# Patient Record
Sex: Female | Born: 1945
Health system: Southern US, Community
[De-identification: ages and names within clinical notes are randomized; demographics above are authoritative.]

## PROBLEM LIST (undated history)

## (undated) DIAGNOSIS — Z923 Personal history of irradiation: Secondary | ICD-10-CM

## (undated) DIAGNOSIS — H33329 Round hole, unspecified eye: Secondary | ICD-10-CM

## (undated) DIAGNOSIS — Z800 Family history of malignant neoplasm of digestive organs: Secondary | ICD-10-CM

## (undated) DIAGNOSIS — J45909 Unspecified asthma, uncomplicated: Secondary | ICD-10-CM

## (undated) DIAGNOSIS — H409 Unspecified glaucoma: Secondary | ICD-10-CM

## (undated) DIAGNOSIS — R7309 Other abnormal glucose: Secondary | ICD-10-CM

## (undated) DIAGNOSIS — K219 Gastro-esophageal reflux disease without esophagitis: Secondary | ICD-10-CM

## (undated) DIAGNOSIS — K589 Irritable bowel syndrome without diarrhea: Secondary | ICD-10-CM

## (undated) DIAGNOSIS — K769 Liver disease, unspecified: Secondary | ICD-10-CM

## (undated) DIAGNOSIS — J309 Allergic rhinitis, unspecified: Secondary | ICD-10-CM

## (undated) DIAGNOSIS — I10 Essential (primary) hypertension: Secondary | ICD-10-CM

## (undated) DIAGNOSIS — M8580 Other specified disorders of bone density and structure, unspecified site: Secondary | ICD-10-CM

## (undated) DIAGNOSIS — Z8051 Family history of malignant neoplasm of kidney: Secondary | ICD-10-CM

## (undated) DIAGNOSIS — Z803 Family history of malignant neoplasm of breast: Secondary | ICD-10-CM

## (undated) DIAGNOSIS — Z8042 Family history of malignant neoplasm of prostate: Secondary | ICD-10-CM

## (undated) DIAGNOSIS — T7840XA Allergy, unspecified, initial encounter: Secondary | ICD-10-CM

## (undated) DIAGNOSIS — E042 Nontoxic multinodular goiter: Secondary | ICD-10-CM

## (undated) HISTORY — DX: Other specified disorders of bone density and structure, unspecified site: M85.80

## (undated) HISTORY — DX: Unspecified asthma, uncomplicated: J45.909

## (undated) HISTORY — PX: OTHER SURGICAL HISTORY: SHX169

## (undated) HISTORY — DX: Nontoxic multinodular goiter: E04.2

## (undated) HISTORY — DX: Irritable bowel syndrome without diarrhea: K58.9

## (undated) HISTORY — DX: Allergy, unspecified, initial encounter: T78.40XA

## (undated) HISTORY — PX: BREAST SURGERY: SHX581

## (undated) HISTORY — DX: Family history of malignant neoplasm of digestive organs: Z80.0

## (undated) HISTORY — DX: Family history of malignant neoplasm of prostate: Z80.42

## (undated) HISTORY — DX: Family history of malignant neoplasm of kidney: Z80.51

## (undated) HISTORY — PX: CHOLECYSTECTOMY: SHX55

## (undated) HISTORY — DX: Family history of malignant neoplasm of breast: Z80.3

## (undated) HISTORY — DX: Essential (primary) hypertension: I10

## (undated) HISTORY — PX: NOSE SURGERY: SHX723

## (undated) HISTORY — DX: Allergic rhinitis, unspecified: J30.9

## (undated) HISTORY — DX: Unspecified glaucoma: H40.9

## (undated) HISTORY — PX: EYE SURGERY: SHX253

## (undated) HISTORY — DX: Round hole, unspecified eye: H33.329

## (undated) HISTORY — DX: Liver disease, unspecified: K76.9

## (undated) HISTORY — DX: Other abnormal glucose: R73.09

## (undated) HISTORY — DX: Gastro-esophageal reflux disease without esophagitis: K21.9

---

## 1987-12-14 HISTORY — PX: ABDOMINAL HYSTERECTOMY: SHX81

## 1997-12-13 HISTORY — PX: BREAST EXCISIONAL BIOPSY: SUR124

## 1998-07-07 ENCOUNTER — Ambulatory Visit (HOSPITAL_BASED_OUTPATIENT_CLINIC_OR_DEPARTMENT_OTHER): Admission: RE | Admit: 1998-07-07 | Discharge: 1998-07-07 | Payer: Self-pay | Admitting: General Surgery

## 1998-09-05 ENCOUNTER — Ambulatory Visit (HOSPITAL_COMMUNITY): Admission: RE | Admit: 1998-09-05 | Discharge: 1998-09-05 | Payer: Self-pay | Admitting: Gastroenterology

## 1999-04-29 ENCOUNTER — Encounter: Payer: Self-pay | Admitting: General Surgery

## 1999-04-30 ENCOUNTER — Ambulatory Visit (HOSPITAL_COMMUNITY): Admission: RE | Admit: 1999-04-30 | Discharge: 1999-05-01 | Payer: Self-pay | Admitting: General Surgery

## 1999-04-30 ENCOUNTER — Encounter: Payer: Self-pay | Admitting: General Surgery

## 1999-10-31 ENCOUNTER — Emergency Department (HOSPITAL_COMMUNITY): Admission: EM | Admit: 1999-10-31 | Discharge: 1999-10-31 | Payer: Self-pay | Admitting: Emergency Medicine

## 1999-10-31 ENCOUNTER — Encounter: Payer: Self-pay | Admitting: Emergency Medicine

## 1999-11-24 ENCOUNTER — Ambulatory Visit: Admission: RE | Admit: 1999-11-24 | Discharge: 1999-11-24 | Payer: Self-pay | Admitting: Pulmonary Disease

## 2000-07-29 ENCOUNTER — Encounter: Admission: RE | Admit: 2000-07-29 | Discharge: 2000-07-29 | Payer: Self-pay | Admitting: Obstetrics and Gynecology

## 2000-07-29 ENCOUNTER — Encounter: Payer: Self-pay | Admitting: Obstetrics and Gynecology

## 2000-09-25 ENCOUNTER — Emergency Department (HOSPITAL_COMMUNITY): Admission: EM | Admit: 2000-09-25 | Discharge: 2000-09-26 | Payer: Self-pay | Admitting: Emergency Medicine

## 2000-09-26 ENCOUNTER — Encounter: Payer: Self-pay | Admitting: Emergency Medicine

## 2001-06-26 ENCOUNTER — Encounter: Admission: RE | Admit: 2001-06-26 | Discharge: 2001-06-26 | Payer: Self-pay | Admitting: Obstetrics and Gynecology

## 2001-06-26 ENCOUNTER — Encounter: Payer: Self-pay | Admitting: Obstetrics and Gynecology

## 2001-09-15 ENCOUNTER — Encounter: Payer: Self-pay | Admitting: Obstetrics and Gynecology

## 2001-09-15 ENCOUNTER — Encounter: Admission: RE | Admit: 2001-09-15 | Discharge: 2001-09-15 | Payer: Self-pay | Admitting: Obstetrics and Gynecology

## 2001-09-19 ENCOUNTER — Encounter: Admission: RE | Admit: 2001-09-19 | Discharge: 2001-09-19 | Payer: Self-pay | Admitting: Family Medicine

## 2001-09-19 ENCOUNTER — Encounter: Payer: Self-pay | Admitting: Family Medicine

## 2002-03-07 ENCOUNTER — Encounter: Payer: Self-pay | Admitting: Gastroenterology

## 2002-03-07 ENCOUNTER — Encounter: Admission: RE | Admit: 2002-03-07 | Discharge: 2002-03-07 | Payer: Self-pay | Admitting: Gastroenterology

## 2002-06-27 ENCOUNTER — Encounter: Payer: Self-pay | Admitting: Obstetrics and Gynecology

## 2002-06-27 ENCOUNTER — Encounter: Admission: RE | Admit: 2002-06-27 | Discharge: 2002-06-27 | Payer: Self-pay | Admitting: Obstetrics and Gynecology

## 2002-10-02 ENCOUNTER — Encounter: Payer: Self-pay | Admitting: Gastroenterology

## 2002-10-02 ENCOUNTER — Encounter: Admission: RE | Admit: 2002-10-02 | Discharge: 2002-10-02 | Payer: Self-pay | Admitting: Gastroenterology

## 2003-04-04 ENCOUNTER — Other Ambulatory Visit: Admission: RE | Admit: 2003-04-04 | Discharge: 2003-04-04 | Payer: Self-pay | Admitting: Obstetrics and Gynecology

## 2003-06-04 ENCOUNTER — Encounter: Payer: Self-pay | Admitting: Obstetrics and Gynecology

## 2003-06-04 ENCOUNTER — Encounter: Admission: RE | Admit: 2003-06-04 | Discharge: 2003-06-04 | Payer: Self-pay | Admitting: Obstetrics and Gynecology

## 2003-09-17 ENCOUNTER — Encounter: Admission: RE | Admit: 2003-09-17 | Discharge: 2003-09-17 | Payer: Self-pay | Admitting: Allergy

## 2003-09-17 ENCOUNTER — Encounter: Payer: Self-pay | Admitting: Allergy

## 2003-11-13 ENCOUNTER — Encounter: Admission: RE | Admit: 2003-11-13 | Discharge: 2003-11-13 | Payer: Self-pay | Admitting: Obstetrics and Gynecology

## 2004-04-13 ENCOUNTER — Other Ambulatory Visit: Admission: RE | Admit: 2004-04-13 | Discharge: 2004-04-13 | Payer: Self-pay | Admitting: Obstetrics and Gynecology

## 2004-10-19 ENCOUNTER — Encounter: Admission: RE | Admit: 2004-10-19 | Discharge: 2004-10-19 | Payer: Self-pay | Admitting: Obstetrics and Gynecology

## 2004-10-29 ENCOUNTER — Encounter: Admission: RE | Admit: 2004-10-29 | Discharge: 2004-10-29 | Payer: Self-pay | Admitting: Obstetrics and Gynecology

## 2005-04-15 ENCOUNTER — Other Ambulatory Visit: Admission: RE | Admit: 2005-04-15 | Discharge: 2005-04-15 | Payer: Self-pay | Admitting: Addiction Medicine

## 2005-11-11 ENCOUNTER — Encounter: Admission: RE | Admit: 2005-11-11 | Discharge: 2005-11-11 | Payer: Self-pay | Admitting: Obstetrics and Gynecology

## 2006-04-13 ENCOUNTER — Encounter: Payer: Self-pay | Admitting: Gastroenterology

## 2006-04-19 ENCOUNTER — Other Ambulatory Visit: Admission: RE | Admit: 2006-04-19 | Discharge: 2006-04-19 | Payer: Self-pay | Admitting: Obstetrics and Gynecology

## 2006-08-12 ENCOUNTER — Encounter: Admission: RE | Admit: 2006-08-12 | Discharge: 2006-08-12 | Payer: Self-pay | Admitting: Gastroenterology

## 2006-11-14 ENCOUNTER — Encounter: Admission: RE | Admit: 2006-11-14 | Discharge: 2006-11-14 | Payer: Self-pay | Admitting: Obstetrics and Gynecology

## 2006-12-13 LAB — HM COLONOSCOPY

## 2007-05-01 ENCOUNTER — Other Ambulatory Visit: Admission: RE | Admit: 2007-05-01 | Discharge: 2007-05-01 | Payer: Self-pay | Admitting: Obstetrics and Gynecology

## 2007-11-20 ENCOUNTER — Encounter: Admission: RE | Admit: 2007-11-20 | Discharge: 2007-11-20 | Payer: Self-pay | Admitting: Obstetrics and Gynecology

## 2007-12-14 HISTORY — PX: OTHER SURGICAL HISTORY: SHX169

## 2008-05-10 ENCOUNTER — Other Ambulatory Visit: Admission: RE | Admit: 2008-05-10 | Discharge: 2008-05-10 | Payer: Self-pay | Admitting: Obstetrics and Gynecology

## 2008-09-09 ENCOUNTER — Ambulatory Visit (HOSPITAL_BASED_OUTPATIENT_CLINIC_OR_DEPARTMENT_OTHER): Admission: RE | Admit: 2008-09-09 | Discharge: 2008-09-09 | Payer: Self-pay | Admitting: Urology

## 2008-09-09 ENCOUNTER — Encounter (INDEPENDENT_AMBULATORY_CARE_PROVIDER_SITE_OTHER): Payer: Self-pay | Admitting: Urology

## 2008-11-20 ENCOUNTER — Encounter: Admission: RE | Admit: 2008-11-20 | Discharge: 2008-11-20 | Payer: Self-pay | Admitting: Obstetrics and Gynecology

## 2009-04-12 LAB — CONVERTED CEMR LAB

## 2009-05-19 ENCOUNTER — Other Ambulatory Visit: Admission: RE | Admit: 2009-05-19 | Discharge: 2009-05-19 | Payer: Self-pay | Admitting: Obstetrics and Gynecology

## 2009-05-19 ENCOUNTER — Ambulatory Visit: Payer: Self-pay | Admitting: Obstetrics and Gynecology

## 2009-05-19 ENCOUNTER — Encounter: Payer: Self-pay | Admitting: Obstetrics and Gynecology

## 2009-06-06 ENCOUNTER — Ambulatory Visit: Payer: Self-pay | Admitting: Obstetrics and Gynecology

## 2009-07-15 ENCOUNTER — Encounter: Payer: Self-pay | Admitting: Endocrinology

## 2009-11-21 ENCOUNTER — Encounter: Admission: RE | Admit: 2009-11-21 | Discharge: 2009-11-21 | Payer: Self-pay | Admitting: Obstetrics and Gynecology

## 2010-02-17 ENCOUNTER — Ambulatory Visit: Payer: Self-pay | Admitting: Endocrinology

## 2010-02-17 DIAGNOSIS — I10 Essential (primary) hypertension: Secondary | ICD-10-CM

## 2010-02-17 DIAGNOSIS — K589 Irritable bowel syndrome without diarrhea: Secondary | ICD-10-CM | POA: Insufficient documentation

## 2010-02-17 DIAGNOSIS — J45909 Unspecified asthma, uncomplicated: Secondary | ICD-10-CM

## 2010-02-17 DIAGNOSIS — K219 Gastro-esophageal reflux disease without esophagitis: Secondary | ICD-10-CM | POA: Insufficient documentation

## 2010-02-17 DIAGNOSIS — J309 Allergic rhinitis, unspecified: Secondary | ICD-10-CM | POA: Insufficient documentation

## 2010-02-17 DIAGNOSIS — E042 Nontoxic multinodular goiter: Secondary | ICD-10-CM | POA: Insufficient documentation

## 2010-02-17 HISTORY — DX: Essential (primary) hypertension: I10

## 2010-02-17 HISTORY — DX: Allergic rhinitis, unspecified: J30.9

## 2010-02-17 HISTORY — DX: Gastro-esophageal reflux disease without esophagitis: K21.9

## 2010-02-17 HISTORY — DX: Unspecified asthma, uncomplicated: J45.909

## 2010-02-17 HISTORY — DX: Nontoxic multinodular goiter: E04.2

## 2010-02-17 HISTORY — DX: Irritable bowel syndrome, unspecified: K58.9

## 2010-02-17 LAB — CONVERTED CEMR LAB: TSH: 0.83 microintl units/mL (ref 0.35–5.50)

## 2010-03-03 ENCOUNTER — Encounter: Payer: Self-pay | Admitting: Endocrinology

## 2010-03-03 ENCOUNTER — Encounter: Admission: RE | Admit: 2010-03-03 | Discharge: 2010-03-03 | Payer: Self-pay | Admitting: Endocrinology

## 2010-03-03 ENCOUNTER — Other Ambulatory Visit: Admission: RE | Admit: 2010-03-03 | Discharge: 2010-03-03 | Payer: Self-pay | Admitting: Diagnostic Radiology

## 2010-05-13 ENCOUNTER — Encounter: Payer: Self-pay | Admitting: Endocrinology

## 2010-05-13 LAB — CONVERTED CEMR LAB: Pap Smear: NORMAL

## 2010-05-20 ENCOUNTER — Ambulatory Visit: Payer: Self-pay | Admitting: Obstetrics and Gynecology

## 2010-05-20 ENCOUNTER — Other Ambulatory Visit: Admission: RE | Admit: 2010-05-20 | Discharge: 2010-05-20 | Payer: Self-pay | Admitting: Obstetrics and Gynecology

## 2010-06-04 ENCOUNTER — Ambulatory Visit: Payer: Self-pay | Admitting: Obstetrics and Gynecology

## 2010-08-21 ENCOUNTER — Ambulatory Visit: Payer: Self-pay | Admitting: Endocrinology

## 2010-08-21 LAB — CONVERTED CEMR LAB: TSH: 0.46 microintl units/mL (ref 0.35–5.50)

## 2010-08-28 ENCOUNTER — Encounter: Admission: RE | Admit: 2010-08-28 | Discharge: 2010-08-28 | Payer: Self-pay | Admitting: Endocrinology

## 2010-11-12 LAB — HM MAMMOGRAPHY: HM Mammogram: NORMAL

## 2010-11-23 ENCOUNTER — Encounter
Admission: RE | Admit: 2010-11-23 | Discharge: 2010-11-23 | Payer: Self-pay | Source: Home / Self Care | Attending: Obstetrics and Gynecology | Admitting: Obstetrics and Gynecology

## 2011-01-12 NOTE — Assessment & Plan Note (Signed)
Summary: 6 MO ROV/NWS  #   Vital Signs:  Patient profile:   65 year old female Height:      65 inches (165.10 cm) Weight:      209 pounds (95.00 kg) BMI:     34.91 O2 Sat:      97 % on Room air Temp:     97.2 degrees F (36.22 degrees C) oral Pulse rate:   63 / minute BP sitting:   102 / 72  (left arm) Cuff size:   large  Vitals Entered By: Brenton Grills MA (August 21, 2010 4:15 PM)  O2 Flow:  Room air CC: 6 month F/U/pt is no longer taking Cleocin/aj   Referring Provider:  Haroldine Laws Primary Provider:  Kirby Funk  CC:  6 month F/U/pt is no longer taking Cleocin/aj.  History of Present Illness: pt had bx for multinodular goiter in march, 2011.  result was low-risk.  she does not notice the goiter.   she has 1 year of moderate irritation at the eyes.  she has associated "uncontrollable need to blink."  Current Medications (verified): 1)  Asmanex 120 Metered Doses 220 Mcg/inh Aepb (Mometasone Furoate) .... 2 Puffs Two Times A Day As Needed 2)  Proair Hfa 108 (90 Base) Mcg/act Aers (Albuterol Sulfate) .... 2 Puffs A Day As Needed 3)  Nasonex 50 Mcg/act Susp (Mometasone Furoate) .... As Needed 4)  Diovan Hct 160-25 Mg Tabs (Valsartan-Hydrochlorothiazide) .Marland Kitchen.. 1 Daily 5)  Amlodipine Besylate 5 Mg Tabs (Amlodipine Besylate) .... Once Daily 6)  Cleocin 150 Mg Caps (Clindamycin Hcl) .Marland Kitchen.. 1 Cap By Mouth Three Times A Day  Allergies (verified): 1)  ! Biaxin  Past History:  Past Medical History: Last updated: 02/17/2010 HYPERTENSION (ICD-401.9) ALLERGIC RHINITIS (ICD-477.9) IBS (ICD-564.1) ASTHMA (ICD-493.90) GERD (ICD-530.81)  Review of Systems       The patient complains of weight gain.  The patient denies vision loss.         she has associated sensitivity of the eyes to light.    Physical Exam  General:  normal appearance.   Eyes:  Conjunctiva and sclera clear without injection, icterus or proptosis.  Neck:  i do not appreciate the goiter Additional Exam:   FastTSH                   0.46 uIU/mL       Impression & Recommendations:  Problem # 1:  GOITER, MULTINODULAR (ICD-241.1)  Other Orders: TLB-TSH (Thyroid Stimulating Hormone) (16109-UEA) Radiology Referral (Radiology) Est. Patient Level III (54098)  Patient Instructions: 1)  given your nodular thyroid disease ("lumpy thyroid"), you should expect the slow development of hyperthyroidism (overactive thyroid) 2)  check thyroid ultrasound.  you will be called with a day and time for an appointment.  3)  a thyroid blood test is being ordered for you today.   4)  please call [phone] to hear your test results, after each of these tests. 5)  Please schedule a follow-up appointment in 1 year.

## 2011-01-12 NOTE — Assessment & Plan Note (Signed)
Summary: new / enlarged thyroid /  bcbs / # cd   Vital Signs:  Patient profile:   65 year old female Height:      65 inches (165.10 cm) Weight:      207.38 pounds (94.26 kg) BMI:     34.63 O2 Sat:      97 % on Room air Temp:     97.6 degrees F (36.44 degrees C) oral Pulse rate:   74 / minute BP sitting:   140 / 92  (left arm) Cuff size:   large  Vitals Entered By: Josph Macho RMA (February 17, 2010 8:32 AM)  O2 Flow:  Room air CC: New Endo: Enlarged Thyroid/ CF   Referring Provider:  Haroldine Laws Primary Provider:  Kirby Funk  CC:  New Endo: Enlarged Thyroid/ CF.  History of Present Illness: pt states 6 mos of pain at the right upper anterior neck, and associated slight swelling there. ct incidentally noted a goiter.    Current Medications (verified): 1)  Asmanex 120 Metered Doses 220 Mcg/inh Aepb (Mometasone Furoate) .... 2 Puffs Two Times A Day As Needed 2)  Proair Hfa 108 (90 Base) Mcg/act Aers (Albuterol Sulfate) .... 2 Puffs A Day As Needed 3)  Nasonex 50 Mcg/act Susp (Mometasone Furoate) .... As Needed 4)  Diovan Hct 160-25 Mg Tabs (Valsartan-Hydrochlorothiazide) .Marland Kitchen.. 1 Daily 5)  Amlodipine Besylate 5 Mg Tabs (Amlodipine Besylate) .... Once Daily 6)  Cleocin 150 Mg Caps (Clindamycin Hcl) .Marland Kitchen.. 1 Cap By Mouth Three Times A Day  Allergies (verified): 1)  ! Biaxin  Past History:  Past Medical History: HYPERTENSION (ICD-401.9) ALLERGIC RHINITIS (ICD-477.9) IBS (ICD-564.1) ASTHMA (ICD-493.90) GERD (ICD-530.81)  Family History: Reviewed history and no changes required. mother (deceased) had a benign goiter  Social History: Reviewed history and no changes required. 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         denies palpitations, diarrhea, polyuria, excessive diaphoresis, numbness, tremor, anxiety, menopausal sxs, and rhinorrhea.  she reports photosensitivity  of the eyes.   she has intermittent hoarseness easy bruising, and myalgias.  she attributes doe to asthma.   Physical Exam  General:  obese.  no distress  Head:  head: no deformity eyes: no periorbital swelling, no proptosis external nose and ears are normal mouth: no lesion seen Neck:  i do not appreciate the goiter Lungs:  Clear to auscultation bilaterally. Normal respiratory effort.  Heart:  Regular rate and rhythm without murmurs or gallops noted. Normal S1,S2.   Msk:  muscle bulk and strength are grossly normal.  no obvious joint swelling.  gait is normal and steady  Extremities:  no deformity no edema Neurologic:  cn 2-12 grossly intact.   readily moves all 4's.   sensation is intact to touch on all 4's Skin:  normal texture and temp.  no rash.  not diaphoretic  Cervical Nodes:  No significant adenopathy.  Psych:  Alert and cooperative; normal mood and affect; normal attention span and concentration.   Additional Exam:  test results are reviewed: i reviewed ct of the neck  today: FastTSH                   0.83 uIU/mL      Impression & Recommendations:  Problem # 1:  GOITER, MULTINODULAR (ICD-241.1) Assessment New usually hereditary  Problem # 2:  neck pain too high up on the  neck to be related to #1  Problem # 3:  weight gain not thyroid-related  Medications Added to Medication List This Visit: 1)  Asmanex 120 Metered Doses 220 Mcg/inh Aepb (Mometasone furoate) .... 2 puffs two times a day as needed 2)  Proair Hfa 108 (90 Base) Mcg/act Aers (Albuterol sulfate) .... 2 puffs a day as needed 3)  Nasonex 50 Mcg/act Susp (Mometasone furoate) .... As needed 4)  Diovan Hct 160-25 Mg Tabs (Valsartan-hydrochlorothiazide) .Marland Kitchen.. 1 daily 5)  Amlodipine Besylate 5 Mg Tabs (Amlodipine besylate) .... Once daily 6)  Cleocin 150 Mg Caps (Clindamycin hcl) .Marland Kitchen.. 1 cap by mouth three times a day  Other Orders: TLB-TSH (Thyroid Stimulating Hormone) [phone]) Radiology Referral  (Radiology) Consultation Level IV (54098)  Patient Instructions: 1)  given your nodular thyroid disease ("lumpy thyroid"), you should expect the slow development of hyperthyroidism (overactive thyroid) 2)  blood test today. 3)  tests are being ordered for you today.  a few days after the test(s), please call [phone] to hear your test results. 4)  (update: i called pt 02/18/10.  tsh is normal.  i ordered US, and us-guided bx.  ret 6 mos if benign).  Preventive Care Screening  Mammogram:    Date:  10/13/2009    Results:  historical   Hemoccult:    Date:  04/12/2009    Results:  historical   Pap Smear:    Date:  04/12/2009    Results:  historical   Last Tetanus Booster:    Date:  02/10/2009    Results:  historical   Colonoscopy:    Date:  12/13/2006    Results:  historical

## 2011-02-20 ENCOUNTER — Encounter: Payer: Self-pay | Admitting: Endocrinology

## 2011-02-20 LAB — CONVERTED CEMR LAB
ALT: 66 units/L
AST: 42 units/L
Albumin: 4.3 g/dL
Alkaline Phosphatase: 68 units/L
BUN: 17 mg/dL
CO2: 25 meq/L
Calcium: 10 mg/dL
Chloride: 101 meq/L
Creatinine, Ser: 0.82 mg/dL
GFR calc Af Amer: >60 mL/min
GFR calc non Af Amer: >60 mL/min
Glucose, Bld: 126 mg/dL
HCT: 40.7 %
Hemoglobin: 13.5 g/dL
Lymphocytes, automated: 25.7 %
MCV: 88 fL
Platelets: 226 10*3/uL
Potassium: 3.6 meq/L
RBC: 4.62 M/uL
RDW: 15 %
Sodium: 136 meq/L
TSH: 0.482 microintl units/mL
Total Bilirubin: 0.3 mg/dL
Total Protein: 6.4 g/dL
WBC: 8 10*3/uL

## 2011-02-22 ENCOUNTER — Ambulatory Visit (INDEPENDENT_AMBULATORY_CARE_PROVIDER_SITE_OTHER): Payer: BC Managed Care – PPO | Admitting: Endocrinology

## 2011-02-22 ENCOUNTER — Encounter: Payer: Self-pay | Admitting: Endocrinology

## 2011-02-22 ENCOUNTER — Other Ambulatory Visit: Payer: BC Managed Care – PPO

## 2011-02-22 ENCOUNTER — Other Ambulatory Visit: Payer: Self-pay | Admitting: Endocrinology

## 2011-02-22 DIAGNOSIS — R7309 Other abnormal glucose: Secondary | ICD-10-CM

## 2011-02-22 DIAGNOSIS — E042 Nontoxic multinodular goiter: Secondary | ICD-10-CM

## 2011-02-22 DIAGNOSIS — K769 Liver disease, unspecified: Secondary | ICD-10-CM

## 2011-02-22 HISTORY — DX: Liver disease, unspecified: K76.9

## 2011-02-22 HISTORY — DX: Other abnormal glucose: R73.09

## 2011-02-22 LAB — HEMOGLOBIN A1C: Hgb A1c MFr Bld: 6 % (ref 4.6–6.5)

## 2011-02-22 LAB — TSH: TSH: 0.69 u[IU]/mL (ref 0.35–5.50)

## 2011-02-23 ENCOUNTER — Other Ambulatory Visit: Payer: Self-pay | Admitting: Endocrinology

## 2011-02-23 DIAGNOSIS — E042 Nontoxic multinodular goiter: Secondary | ICD-10-CM

## 2011-02-23 LAB — CONVERTED CEMR LAB
HCV Ab: NEGATIVE
Hepatitis B Surface Ag: NEGATIVE

## 2011-02-24 ENCOUNTER — Other Ambulatory Visit: Payer: Self-pay | Admitting: Internal Medicine

## 2011-02-24 DIAGNOSIS — R270 Ataxia, unspecified: Secondary | ICD-10-CM

## 2011-02-24 DIAGNOSIS — R42 Dizziness and giddiness: Secondary | ICD-10-CM

## 2011-02-24 DIAGNOSIS — R519 Headache, unspecified: Secondary | ICD-10-CM

## 2011-02-24 DIAGNOSIS — G8929 Other chronic pain: Secondary | ICD-10-CM

## 2011-02-28 ENCOUNTER — Ambulatory Visit
Admission: RE | Admit: 2011-02-28 | Discharge: 2011-02-28 | Disposition: A | Discharge status: Home / Self Care | Payer: BC Managed Care – PPO | Source: Ambulatory Visit | Attending: Internal Medicine | Admitting: Internal Medicine

## 2011-02-28 DIAGNOSIS — R42 Dizziness and giddiness: Secondary | ICD-10-CM

## 2011-02-28 DIAGNOSIS — R270 Ataxia, unspecified: Secondary | ICD-10-CM

## 2011-02-28 DIAGNOSIS — R519 Headache, unspecified: Secondary | ICD-10-CM

## 2011-02-28 DIAGNOSIS — G8929 Other chronic pain: Secondary | ICD-10-CM

## 2011-03-01 ENCOUNTER — Ambulatory Visit
Admission: RE | Admit: 2011-03-01 | Discharge: 2011-03-01 | Disposition: A | Discharge status: Home / Self Care | Payer: BC Managed Care – PPO | Source: Ambulatory Visit | Attending: Endocrinology | Admitting: Endocrinology

## 2011-03-01 DIAGNOSIS — E042 Nontoxic multinodular goiter: Secondary | ICD-10-CM

## 2011-03-02 NOTE — Letter (Signed)
Summary: [facility]   Imported By: Lennie Odor 02/25/2011 13:56:40  _____________________________________________________________________  External Attachment:    Type:   Image     Comment:   External Document

## 2011-03-11 NOTE — Assessment & Plan Note (Signed)
Summary: URG CARE FU--URG CARE THIS PAST SAT--HIGH LIVER FUNCTION AND .Marland KitchenMarland Kitchen   Vital Signs:  Patient profile:   65 year old female Height:      65 inches (165.10 cm) Weight:      204.31 pounds (92.87 kg) BMI:     34.12 O2 Sat:      96 % on Room air Temp:     98.7 degrees F (37.06 degrees C) oral Pulse rate:   78 / minute Pulse rhythm:   regular BP sitting:   112 / 72  (left arm) Cuff size:   large  Vitals Entered By: Brenton Grills CMA Duncan Dull) (February 22, 2011 3:22 PM)  O2 Flow:  Room air CC: Follow up/high glucose and liver function test/aj Is Patient Diabetic? No   Referring Provider:  Haroldine Laws Primary Provider:  Kirby Funk  CC:  Follow up/high glucose and liver function test/aj.  History of Present Illness: the status of at least 3 ongoing medical problems is addressed today: 1.  pt was seen at urgent care a few days ago for vertigo, and liver tests were high.  no abd pain. 2.  she says she was also noted to have high blood sugar. no weight change. 3.  goiter:  pt says she does not notice it.   she is now due for f/u here, and wants to have labs to f/u the other 2 probs while she is here.   Current Medications (verified): 1)  Symbicort 160-4.5 Mcg/act Aero (Budesonide-Formoterol Fumarate) .... 2 Puffs Two Times A Day As Needed 2)  Proair Hfa 108 (90 Base) Mcg/act Aers (Albuterol Sulfate) .... 2 Puffs A Day As Needed 3)  Nasonex 50 Mcg/act Susp (Mometasone Furoate) .... As Needed 4)  Diovan Hct 160-25 Mg Tabs (Valsartan-Hydrochlorothiazide) .Marland Kitchen.. 1 Daily 5)  Amlodipine Besylate 5 Mg Tabs (Amlodipine Besylate) .... Once Daily 6)  Klor-Con M20 20 Meq Cr-Tabs (Potassium Chloride Crys Cr) .Marland Kitchen.. 1 Tablet By Mouth Once Daily 7)  Flonase 50 Mcg/act Susp (Fluticasone Propionate) .... As Needed 8)  Restasis 0.05 % Emul (Cyclosporine) .Marland Kitchen.. 1 Tube Every 12 Hours 9)  Fish Oil 1000 Mg Caps (Omega-3 Fatty Acids) .Marland Kitchen.. 1 By Mouth Two Times A Day  Allergies (verified): 1)  !  Biaxin  Family History: Reviewed history from 02/17/2010 and no changes required. mother (deceased) had a benign goiter dm: none  Review of Systems       she reports back pain and diaphoresis.    Physical Exam  General:  normal appearance.   Neck:  i do not appreciate the goiter Abdomen:  abdomen is soft, nontender.  no hepatosplenomegaly.   not distended.  no hernia  Extremities:  no edema Additional Exam:  outside test results are reviewed:  tsh=0.48 ast=42 alt=66  today: FastTSH                   0.69 uIU/mL                 0.35-5.50 Hemoglobin A1C            6.0 %   Hepatitis B Surface Antigen        NEGATIVE Hepatitis C Antibody      NEGATIVE      Impression & Recommendations:  Problem # 1:  HYPERGLYCEMIA (ICD-790.29) Assessment Unchanged  Problem # 2:  GOITER, MULTINODULAR (ICD-241.1) Assessment: Unchanged  Problem # 3:  UNSPECIFIED DISORDER OF LIVER (ICD-573.9) uncertain etiology  Medications Added to Medication List This Visit:  1)  Symbicort 160-4.5 Mcg/act Aero (Budesonide-formoterol fumarate) .... 2 puffs two times a day as needed 2)  Klor-con M20 20 Meq Cr-tabs (Potassium chloride crys cr) .Marland Kitchen.. 1 tablet by mouth once daily 3)  Flonase 50 Mcg/act Susp (Fluticasone propionate) .... As needed 4)  Restasis 0.05 % Emul (Cyclosporine) .Marland Kitchen.. 1 tube every 12 hours 5)  Fish Oil 1000 Mg Caps (Omega-3 fatty acids) .Marland Kitchen.. 1 by mouth two times a day  Other Orders: T-Hepatitis B Surface Antigen (66440-34742) T-Hepatitis C Antibody (59563-87564) Radiology Referral (Radiology) TLB-TSH (Thyroid Stimulating Hormone) (84443-TSH) TLB-A1C / Hgb A1C (Glycohemoglobin) (83036-A1C) Est. Patient Level IV (33295)  Patient Instructions: 1)  cc dr j griffin, and dr Milus Glazier. 2)  please make an appointment with dr Valentina Lucks for your medical needs 3)  recheck thyroid ultrasound.  you will be called with a day and time for an appointment. 4)  blood tests are being ordered for  you today.  please call [phone] to hear your test results.   5)  Please schedule a follow-up appointment for your thyroid in 1 year.  6)  we are checking for hepatitis on today's blood tests, but that is unlikely.  much more likely is that your liver tests are high due to a recent respiratory virus infection (which gets better on its own), or "fatty liver" (weight loss helps). 7)  (update: i left message on phone-tree: hepatitis labs are neg)   Orders Added: 1)  T-Hepatitis B Surface Antigen [06301-60109] 2)  T-Hepatitis C Antibody [32355-73220] 3)  Radiology Referral [Radiology] 4)  TLB-TSH (Thyroid Stimulating Hormone) [84443-TSH] 5)  TLB-A1C / Hgb A1C (Glycohemoglobin) [83036-A1C] 6)  Est. Patient Level IV [25427]   Immunization History:  Influenza Immunization History:    Influenza:  historical (09/12/2010)   Immunization History:  Influenza Immunization History:    Influenza:  Historical (09/12/2010)   Preventive Care Screening  Mammogram:    Date:  11/12/2010    Results:  normal   Pap Smear:    Date:  05/13/2010    Results:  normal

## 2011-04-27 NOTE — Op Note (Signed)
Rachel Barrett, Rachel Barrett                 ACCOUNT NO.:  [id number]   MEDICAL RECORD NO.:  [id number]          PATIENT TYPE:  AMB   LOCATION:  [facility]                         FACILITY:  [facility]   PHYSICIAN:  Valetta Fuller, M.D.  DATE OF BIRTH:  02-22-1946   DATE OF PROCEDURE:  09/09/2008  DATE OF DISCHARGE:                               OPERATIVE REPORT   PREOPERATIVE DIAGNOSIS:  Urethral/bladder neck mass.   POSTOPERATIVE DIAGNOSIS:  Urethral/bladder neck mass.   PROCEDURE.:  1. Flexible cystourethroscopy.  2. Rigid cystourethroscopy.  3. Transurethral resection of bladder neck/urethral mass.  4. Urethral dilation.   ATTENDING PHYSICIAN:  Dr. Barron Alvine.   RESIDENT PHYSICIAN:  Dr. Delman Kitten.   ANESTHESIA:  General.   INDICATIONS FOR PROCEDURE:  Rachel Barrett is a 65 year old African American  female who was initially seen by Dr. Isabel Barrett in referral for vaginal  spotting.  She had a complete negative workup by her gynecologist.  She  also had some disruptive urinary symptoms according to her.  This  included abrupt cessation of flow.  She underwent a flexible  cystourethroscopy in the office which demonstrated normal bladder  mucosa.  However, on retroflexion of the scope, there was evidence of  what appeared to be abnormal papillary fronds on the right and left  lateral bladder neck areas.  She is being brought to the operating room  today to further evaluate this.  Prior to the operation, all risks,  benefits, and consequences were discussed, and informed consent was  obtained.   PROCEDURE IN DETAIL:  The patient was brought to the operating room,  placed in supine position.  She was correctly identified by wristband,  and appropriate time-out was taken.  IV antibiotics were administered.  General anesthesia was delivered.  Once adequately anesthetized, she was  placed in dorsal lithotomy position.  Great care taken to minimize the  risk of peripheral neuropathy or compartment  syndrome.  Her perineum was  prepped and draped sterilely.  We began our procedure by performing  flexible cystourethroscopy.  Urethra appeared normal on passage of the  scope into the bladder.  Upon entering the bladder, clear urine was  identified.  Both ureteral orifices were noted to be in their normal  anatomic position effluxing clear urine.  She has slight trabeculation  of her bladder, but no other visible abnormalities to the urothelium  were appreciated.  There was no stones seen.  No papillary lesions were  appreciated in the bladder.  When the flexible cystoscope was  retroflexed, the areas seen in clinic along the right side of her  bladder neck as well as left side of the bladder neck showed very subtle  papillary frondular abnormality.  This may represent normal mucosa for  her, but it appeared abnormal.  Photographs were taken.  We then removed  the flexible cystoscope and replaced it with a rigid cystoscope, and the  above findings were verified.  We then removed the cystoscope and  serially dilated up her urethra using the female sounds to a caliber of  32-French.  We then used a  28-French resectoscope with a thin wire loop  and then resected first the right-sided and the left-sided lesions.  We  fulgurated the bleeders as well.  We retrieved all lesions and will send  them to pathology together.  We then drained her bladder, removed the  resectoscope, and placed 1 vial of Uro-Jet viscous lidocaine per  urethra.  This marked the end of our procedure.  She tolerated the  procedure well.  She was woken from anesthesia and was taken to recovery  room in stable condition.  There were no complications.  Dr. Isabel Barrett was  present and participated in all aspects of case.     ______________________________  Delman Kitten, M.D.      Valetta Fuller, M.D.  Electronically Signed    DW/MEDQ  D:  09/09/2008  T:  09/09/2008  Job:  [id number]

## 2011-04-28 ENCOUNTER — Ambulatory Visit (HOSPITAL_COMMUNITY)
Admission: RE | Admit: 2011-04-28 | Discharge: 2011-04-28 | Disposition: A | Discharge status: Home / Self Care | Payer: BC Managed Care – PPO | Source: Ambulatory Visit | Attending: Endocrinology | Admitting: Endocrinology

## 2011-04-28 DIAGNOSIS — M79609 Pain in unspecified limb: Secondary | ICD-10-CM

## 2011-04-28 DIAGNOSIS — I10 Essential (primary) hypertension: Secondary | ICD-10-CM | POA: Insufficient documentation

## 2011-04-28 DIAGNOSIS — M7989 Other specified soft tissue disorders: Secondary | ICD-10-CM | POA: Insufficient documentation

## 2011-05-31 ENCOUNTER — Other Ambulatory Visit (HOSPITAL_COMMUNITY)
Admission: RE | Admit: 2011-05-31 | Discharge: 2011-05-31 | Disposition: A | Discharge status: Home / Self Care | Payer: BC Managed Care – PPO | Source: Ambulatory Visit | Attending: Obstetrics and Gynecology | Admitting: Obstetrics and Gynecology

## 2011-05-31 ENCOUNTER — Encounter (INDEPENDENT_AMBULATORY_CARE_PROVIDER_SITE_OTHER): Payer: BC Managed Care – PPO | Admitting: Obstetrics and Gynecology

## 2011-05-31 ENCOUNTER — Other Ambulatory Visit: Payer: Self-pay | Admitting: Obstetrics and Gynecology

## 2011-05-31 DIAGNOSIS — Z01419 Encounter for gynecological examination (general) (routine) without abnormal findings: Secondary | ICD-10-CM

## 2011-05-31 DIAGNOSIS — Z124 Encounter for screening for malignant neoplasm of cervix: Secondary | ICD-10-CM | POA: Insufficient documentation

## 2011-08-06 ENCOUNTER — Ambulatory Visit
Admission: RE | Admit: 2011-08-06 | Discharge: 2011-08-06 | Disposition: A | Discharge status: Home / Self Care | Payer: BC Managed Care – PPO | Source: Ambulatory Visit | Attending: Otolaryngology | Admitting: Otolaryngology

## 2011-08-06 ENCOUNTER — Other Ambulatory Visit: Payer: Self-pay | Admitting: Otolaryngology

## 2011-08-06 DIAGNOSIS — J40 Bronchitis, not specified as acute or chronic: Secondary | ICD-10-CM

## 2011-08-23 ENCOUNTER — Encounter: Payer: Self-pay | Admitting: Endocrinology

## 2011-08-23 ENCOUNTER — Ambulatory Visit (INDEPENDENT_AMBULATORY_CARE_PROVIDER_SITE_OTHER): Payer: BC Managed Care – PPO | Admitting: Endocrinology

## 2011-08-23 VITALS — BP 122/86 | HR 61 | Temp 98.1°F | Ht 65.0 in | Wt 201.0 lb

## 2011-08-23 DIAGNOSIS — E042 Nontoxic multinodular goiter: Secondary | ICD-10-CM

## 2011-08-23 NOTE — Patient Instructions (Addendum)
blood tests are being requested for you today.  please call [phone] to hear your test results.  You will be prompted to enter the 9-digit "MRN" number that appears at the top left of this page, followed by #.  Then you will hear the message. most of the time, a "lumpy thyroid" will eventually become overactive.  this is usually a slow process, happening over the span of many years. Please return in 1 year (update: pt says she had this done at [facility], so she'll send [place] a copy)

## 2011-08-23 NOTE — Progress Notes (Signed)
  Subjective:    Patient ID: Rachel Barrett, female    DOB: [birth year], 66 y.o.   MRN: [id number]  HPI Pt returns for f/u of multinodular goiter.  pt states she feels well in general. She does not notice the goiter.   Past Medical History  Diagnosis Date  . GOITER, MULTINODULAR 02/17/2010  . HYPERTENSION 02/17/2010  . ALLERGIC RHINITIS 02/17/2010  . ASTHMA 02/17/2010  . GERD 02/17/2010  . IBS 02/17/2010  . Unspecified disorder of liver 02/22/2011  . HYPERGLYCEMIA 02/22/2011    No past surgical history on file.  History   Social History  . Marital Status: Married    Spouse Name: N/A    Number of Children: N/A  . Years of Education: N/A   Occupational History  . Administration [employer]   Social History Main Topics  . Smoking status: Never Smoker   . Smokeless tobacco: Not on file  . Alcohol Use:   . Drug Use:   . Sexually Active:    Other Topics Concern  . Not on file   Social History Narrative  . No narrative on file    Current Outpatient Prescriptions on File Prior to Visit  Medication Sig Dispense Refill  . albuterol (PROAIR HFA) 108 (90 BASE) MCG/ACT inhaler Inhale 2 puffs into the lungs as needed.        Marland Kitchen amLODipine (NORVASC) 5 MG tablet Take 5 mg by mouth daily.        . budesonide-formoterol (SYMBICORT) 160-4.5 MCG/ACT inhaler Inhale 2 puffs into the lungs 2 (two) times daily as needed.        . fish oil-omega-3 fatty acids 1000 MG capsule Take 1 capsule by mouth 2 (two) times daily.        . fluticasone (FLONASE) 50 MCG/ACT nasal spray Place 2 sprays into the nose as needed.        . potassium chloride SA (K-DUR,KLOR-CON) 20 MEQ tablet Take 20 mEq by mouth daily.        . valsartan-hydrochlorothiazide (DIOVAN-HCT) 160-25 MG per tablet Take 1 tablet by mouth daily.         Allergies  Allergen Reactions  . Clarithromycin    Family History  Problem Relation Age of Onset  . Goiter Mother     benign  . Diabetes Neg Hx    BP 122/86  Pulse 61  Temp(Src) 98.1 F  (36.7 C) (Oral)  Ht 5\' 5"  (1.651 m)  Wt 201 lb (91.173 kg)  BMI 33.45 kg/m2  SpO2 96%  Review of Systems Denies weight change    Objective:   Physical Exam VITAL SIGNS:  See vs page GENERAL: no distress Neck: i think i can feel the top of a goiter, but i can't be sure.    Assessment & Plan:  Multinodular goiter.  She is at risk for hyperthyroidism

## 2011-09-13 LAB — BASIC METABOLIC PANEL
BUN: 13
CO2: 30
Calcium: 10.1
Chloride: 103
Creatinine, Ser: 0.77
GFR calc Af Amer: >60
GFR calc non Af Amer: >60
Glucose, Bld: 113 — ABNORMAL HIGH
Potassium: 3.9
Sodium: 140

## 2011-09-13 LAB — POCT HEMOGLOBIN-HEMACUE
Hemoglobin: 0 — CL
Hemoglobin: 13.7

## 2011-10-27 ENCOUNTER — Other Ambulatory Visit: Payer: Self-pay | Admitting: Obstetrics and Gynecology

## 2011-10-27 DIAGNOSIS — Z1231 Encounter for screening mammogram for malignant neoplasm of breast: Secondary | ICD-10-CM

## 2011-11-26 ENCOUNTER — Ambulatory Visit
Admission: RE | Admit: 2011-11-26 | Discharge: 2011-11-26 | Disposition: A | Discharge status: Home / Self Care | Payer: BC Managed Care – PPO | Source: Ambulatory Visit | Attending: Obstetrics and Gynecology | Admitting: Obstetrics and Gynecology

## 2011-11-26 ENCOUNTER — Ambulatory Visit: Payer: BC Managed Care – PPO

## 2011-11-26 DIAGNOSIS — Z1231 Encounter for screening mammogram for malignant neoplasm of breast: Secondary | ICD-10-CM

## 2012-05-23 ENCOUNTER — Encounter: Payer: Self-pay | Admitting: Gynecology

## 2012-05-23 DIAGNOSIS — D219 Benign neoplasm of connective and other soft tissue, unspecified: Secondary | ICD-10-CM | POA: Insufficient documentation

## 2012-05-23 DIAGNOSIS — M8580 Other specified disorders of bone density and structure, unspecified site: Secondary | ICD-10-CM | POA: Insufficient documentation

## 2012-05-31 ENCOUNTER — Encounter: Payer: BC Managed Care – PPO | Admitting: Obstetrics and Gynecology

## 2012-06-11 ENCOUNTER — Telehealth: Payer: Self-pay

## 2012-06-11 ENCOUNTER — Ambulatory Visit (INDEPENDENT_AMBULATORY_CARE_PROVIDER_SITE_OTHER): Payer: BC Managed Care – PPO | Admitting: Emergency Medicine

## 2012-06-11 ENCOUNTER — Ambulatory Visit: Payer: BC Managed Care – PPO

## 2012-06-11 VITALS — BP 128/76 | HR 71 | Temp 97.8°F | Resp 20 | Ht 64.0 in | Wt 198.8 lb

## 2012-06-11 DIAGNOSIS — W57XXXA Bitten or stung by nonvenomous insect and other nonvenomous arthropods, initial encounter: Secondary | ICD-10-CM

## 2012-06-11 DIAGNOSIS — R109 Unspecified abdominal pain: Secondary | ICD-10-CM

## 2012-06-11 DIAGNOSIS — R52 Pain, unspecified: Secondary | ICD-10-CM

## 2012-06-11 DIAGNOSIS — R110 Nausea: Secondary | ICD-10-CM

## 2012-06-11 LAB — COMPREHENSIVE METABOLIC PANEL
ALT: 29 U/L (ref 0–35)
AST: 23 U/L (ref 0–37)
Albumin: 4.5 g/dL (ref 3.5–5.2)
Alkaline Phosphatase: 66 U/L (ref 39–117)
BUN: 14 mg/dL (ref 6–23)
CO2: 31 mEq/L (ref 19–32)
Calcium: 9.7 mg/dL (ref 8.4–10.5)
Chloride: 103 mEq/L (ref 96–112)
Creat: 0.8 mg/dL (ref 0.50–1.10)
Glucose, Bld: 59 mg/dL — ABNORMAL LOW (ref 70–99)
Potassium: 3.5 mEq/L (ref 3.5–5.3)
Sodium: 141 mEq/L (ref 135–145)
Total Bilirubin: 0.5 mg/dL (ref 0.3–1.2)
Total Protein: 6.5 g/dL (ref 6.0–8.3)

## 2012-06-11 LAB — POCT CBC
Granulocyte percent: 57.1 %G (ref 37–80)
HCT, POC: 45 % (ref 37.7–47.9)
Hemoglobin: 14.2 g/dL (ref 12.2–16.2)
Lymph, poc: 2.3 (ref 0.6–3.4)
MCH, POC: 29.3 pg (ref 27–31.2)
MCHC: 31.6 g/dL — AB (ref 31.8–35.4)
MCV: 93 fL (ref 80–97)
MID (cbc): 0.7 (ref 0–0.9)
MPV: 9 fL (ref 0–99.8)
POC Granulocyte: 4.1 (ref 2–6.9)
POC LYMPH PERCENT: 32.7 %L (ref 10–50)
POC MID %: 10.2 %M (ref 0–12)
Platelet Count, POC: 234 10*3/uL (ref 142–424)
RBC: 4.84 M/uL (ref 4.04–5.48)
RDW, POC: 14.4 %
WBC: 7.1 10*3/uL (ref 4.6–10.2)

## 2012-06-11 LAB — POCT UA - MICROSCOPIC ONLY
Bacteria, U Microscopic: NEGATIVE
Casts, Ur, LPF, POC: NEGATIVE
Crystals, Ur, HPF, POC: NEGATIVE
Mucus, UA: POSITIVE
Yeast, UA: NEGATIVE

## 2012-06-11 LAB — POCT URINALYSIS DIPSTICK
Bilirubin, UA: NEGATIVE
Blood, UA: NEGATIVE
Glucose, UA: NEGATIVE
Ketones, UA: NEGATIVE
Leukocytes, UA: NEGATIVE
Nitrite, UA: NEGATIVE
Protein, UA: NEGATIVE
Spec Grav, UA: 1.015
Urobilinogen, UA: 0.2
pH, UA: 6

## 2012-06-11 LAB — LIPASE: Lipase: 24 U/L (ref 0–75)

## 2012-06-11 LAB — AMYLASE: Amylase: 50 U/L (ref 0–105)

## 2012-06-11 MED ORDER — DOXYCYCLINE HYCLATE 100 MG PO CAPS: 100.0000 mg | ORAL_CAPSULE | Freq: Two times a day (BID) | ORAL | Status: AC

## 2012-06-11 MED ORDER — DOXYCYCLINE HYCLATE 100 MG PO CAPS: 100.0000 mg | ORAL_CAPSULE | Freq: Two times a day (BID) | ORAL | Status: DC

## 2012-06-11 NOTE — Telephone Encounter (Signed)
Rx re sent to [facility] on [address]  Patient notified.

## 2012-06-11 NOTE — Progress Notes (Signed)
Subjective:    Patient ID: Rachel Barrett, female    DOB: 08/02/1946, 66 y.o.   MRN: [id number]  HPI patient has been feeling bad for about the last week the she has felt bloated. She has had intermittent abdominal pain and she is very concerned she has a serious illness in that multiple family members had recently been diagnosed with cancer.  Pt presents with ear ache,headache,sore throat, denies any hoarseness,nasal congestion, chills, muscle ache, abdominal pain.  Denies any dysuria when urinating.  Complains of right side back pain. Has had gallbladder removed and has not followed up with Rachel Barrett. Was bit by tick on abdominal when visiting brother.  Has had previous colonoscopy and stated having small polyps, but not removed. Mammography coming up in December 2013. Had partial hysterectomy.  Recent death of brother due to lung CA. Pt is non-smoker.   Will be following up with her PCP Rachel Barrett for a check up.   Review of Systems     Objective:   Physical Exam  Constitutional: She appears well-developed and well-nourished.  HENT:  Head: Normocephalic and atraumatic.  Eyes: Pupils are equal, round, and reactive to light.  Neck: No thyromegaly present.  Cardiovascular: Normal rate and regular rhythm.   Pulmonary/Chest: Effort normal and breath sounds normal. No respiratory distress. She has no wheezes. She has no rales.  Abdominal: She exhibits no distension. There is no tenderness. There is no rebound and no guarding.  Musculoskeletal: Normal range of motion.   Results for orders placed in visit on 06/11/12  POCT CBC      Component Value Range   WBC 7.1  4.6 - 10.2 K/uL   Lymph, poc 2.3  0.6 - 3.4   POC LYMPH PERCENT 32.7  10 - 50 %L   MID (cbc) 0.7  0 - 0.9   POC MID % 10.2  0 - 12 %M   POC Granulocyte 4.1  2 - 6.9   Granulocyte percent 57.1  37 - 80 %G   RBC 4.84  4.04 - 5.48 M/uL   Hemoglobin 14.2  12.2 - 16.2 g/dL   HCT, POC 21.3  08.6 - 47.9 %   MCV 93.0  80 -  97 fL   MCH, POC 29.3  27 - 31.2 pg   MCHC 31.6 (*) 31.8 - 35.4 g/dL   RDW, POC 57.8     Platelet Count, POC 234  142 - 424 K/uL   MPV 9.0  0 - 99.8 fL  POCT UA - MICROSCOPIC ONLY      Component Value Range   WBC, Ur, HPF, POC 0-1     RBC, urine, microscopic 0-1     Bacteria, U Microscopic negative     Mucus, UA positive     Epithelial cells, urine per micros 1-2     Crystals, Ur, HPF, POC negative     Casts, Ur, LPF, POC negative     Yeast, UA negative    POCT URINALYSIS DIPSTICK      Component Value Range   Color, UA yellow     Clarity, UA clear     Glucose, UA negative     Bilirubin, UA negative     Ketones, UA negative     Spec Grav, UA 1.015     Blood, UA negaitve     pH, UA 6.0     Protein, UA negative     Urobilinogen, UA 0.2     Nitrite, UA negative  Leukocytes, UA Negative       [facility] reading (PRIMARY) by  Rachel Barrett no acute findings in the acute abdominal series. Chest x-ray shows no pneumonia. Abdominal series is unremarkable      Assessment & Plan:   Patient appears to have had an acute upper respiratory infection. She has a persistent cough the she has had some bloating and abdominal discomfort however is already had her gallbladder out. She is very concerned about possible cancer. Multiple family members in the last year have died of cancer and she is very concerned that she also has cancer

## 2012-06-11 NOTE — Progress Notes (Signed)
  Subjective:    Patient ID: Rachel Barrett, female    DOB: 11-Nov-1946, 67 y.o.   MRN: [id number]  HPI Review of Systems   Ears: Eyes: wears glasses regularly  Objective:   Physical Exam  Abdominal pain on RLQ.  Marland Kitchen       Assessment & Plan:

## 2012-06-11 NOTE — Telephone Encounter (Signed)
Pt seen in office today was given rx and wanted it sent to [facility] on [address] [phone] pt normally get rx sent to her by mail order if there taken 90 or more please resent rx to [facility]

## 2012-06-12 ENCOUNTER — Encounter: Payer: Self-pay | Admitting: *Deleted

## 2012-06-20 ENCOUNTER — Encounter: Payer: Self-pay | Admitting: Obstetrics and Gynecology

## 2012-06-20 ENCOUNTER — Other Ambulatory Visit (HOSPITAL_COMMUNITY)
Admission: RE | Admit: 2012-06-20 | Discharge: 2012-06-20 | Disposition: A | Discharge status: Home / Self Care | Payer: BC Managed Care – PPO | Source: Ambulatory Visit | Attending: Obstetrics and Gynecology | Admitting: Obstetrics and Gynecology

## 2012-06-20 ENCOUNTER — Ambulatory Visit (INDEPENDENT_AMBULATORY_CARE_PROVIDER_SITE_OTHER): Payer: BC Managed Care – PPO | Admitting: Obstetrics and Gynecology

## 2012-06-20 VITALS — BP 130/80 | Ht 64.0 in | Wt 202.0 lb

## 2012-06-20 DIAGNOSIS — Z01419 Encounter for gynecological examination (general) (routine) without abnormal findings: Secondary | ICD-10-CM

## 2012-06-20 NOTE — Progress Notes (Signed)
The patient came to see me today for her annual GYN exam. She is now having additional episodes of midepigastric pain. She has had problems off and on for many years. We have done ultrasounds without ever finding ovarian pathology. She is status post abdominal hysterectomy. She was seen by several gastroenterologist. Most recently a CT scan of her abdomen and pelvis has been ordered and will be done this week. We will get a copy of it. She a mammogram last December and was told it was normal. We did not get a copy but she will get [place] one. She is due for followup bone density. Her last bone density was December, 2010 and showed low bone mass without an elevated fracture risk. She has had no fractures. She is having no vaginal bleeding. She does her lab work elsewhere. She has never had an abnormal Pap smear. Her last Pap smear was June, 2012.  HEENT: Within normal limits. Kennon Portela present. Neck: No masses. Supraclavicular lymph nodes: Not enlarged. Breasts: Examined in both sitting and lying position. Symmetrical without skin changes or masses. Abdomen: Soft no masses guarding or rebound. No hernias. Pelvic: External within normal limits. BUS within normal limits. Vaginal examination shows good estrogen effect, no cystocele enterocele or rectocele. Small vaginal cyst at top of cuff stable for many years. Cervix and uterus absent. Adnexa within normal limits. Rectovaginal confirmatory. Extremities within normal limits.  Assessment: #1. Recurrent abdominal pain #2. Asymptomatic small vaginal cuff cyst #3. Osteopenia.  Plan: She will get me her last mammogram. When she gets her mammogram this December she will also get a bone density. She will make sure we get a copy of her CT scan.The new Pap smear guidelines were discussed with the patient. Pap done at patient request.

## 2012-06-21 LAB — URINALYSIS W MICROSCOPIC + REFLEX CULTURE
Bacteria, UA: NONE SEEN
Bilirubin Urine: NEGATIVE
Casts: NONE SEEN
Crystals: NONE SEEN
Glucose, UA: NEGATIVE mg/dL
Hgb urine dipstick: NEGATIVE
Ketones, ur: NEGATIVE mg/dL
Leukocytes, UA: NEGATIVE
Nitrite: NEGATIVE
Protein, ur: NEGATIVE mg/dL
Specific Gravity, Urine: 1.022 (ref 1.005–1.030)
Squamous Epithelial / LPF: NONE SEEN
Urobilinogen, UA: 0.2 mg/dL (ref 0.0–1.0)
pH: 5.5 (ref 5.0–8.0)

## 2012-06-23 ENCOUNTER — Ambulatory Visit
Admission: RE | Admit: 2012-06-23 | Discharge: 2012-06-23 | Disposition: A | Discharge status: Home / Self Care | Payer: BC Managed Care – PPO | Source: Ambulatory Visit | Attending: Emergency Medicine | Admitting: Emergency Medicine

## 2012-06-23 DIAGNOSIS — R110 Nausea: Secondary | ICD-10-CM

## 2012-06-23 DIAGNOSIS — R109 Unspecified abdominal pain: Secondary | ICD-10-CM

## 2012-06-23 MED ORDER — IOHEXOL 300 MG/ML  SOLN
100.0000 mL | Freq: Once | INTRAMUSCULAR | Status: AC | PRN
  Administered 2012-06-23: 100 mL via INTRAVENOUS

## 2012-07-18 DIAGNOSIS — H401190 Primary open-angle glaucoma, unspecified eye, stage unspecified: Secondary | ICD-10-CM | POA: Insufficient documentation

## 2012-07-18 DIAGNOSIS — H35419 Lattice degeneration of retina, unspecified eye: Secondary | ICD-10-CM | POA: Insufficient documentation

## 2012-07-18 DIAGNOSIS — H209 Unspecified iridocyclitis: Secondary | ICD-10-CM | POA: Insufficient documentation

## 2012-07-18 DIAGNOSIS — H33329 Round hole, unspecified eye: Secondary | ICD-10-CM | POA: Insufficient documentation

## 2012-09-28 DIAGNOSIS — J45909 Unspecified asthma, uncomplicated: Secondary | ICD-10-CM | POA: Insufficient documentation

## 2012-10-31 ENCOUNTER — Other Ambulatory Visit: Payer: Self-pay | Admitting: Obstetrics and Gynecology

## 2012-11-01 ENCOUNTER — Other Ambulatory Visit: Payer: Self-pay | Admitting: *Deleted

## 2012-11-01 DIAGNOSIS — M8580 Other specified disorders of bone density and structure, unspecified site: Secondary | ICD-10-CM

## 2012-11-03 ENCOUNTER — Other Ambulatory Visit: Payer: Self-pay | Admitting: Obstetrics and Gynecology

## 2012-11-03 DIAGNOSIS — Z1231 Encounter for screening mammogram for malignant neoplasm of breast: Secondary | ICD-10-CM

## 2012-11-20 ENCOUNTER — Ambulatory Visit (INDEPENDENT_AMBULATORY_CARE_PROVIDER_SITE_OTHER): Payer: BC Managed Care – PPO | Admitting: Endocrinology

## 2012-11-20 ENCOUNTER — Encounter: Payer: Self-pay | Admitting: Endocrinology

## 2012-11-20 VITALS — BP 126/82 | HR 54 | Temp 97.8°F | Wt 202.0 lb

## 2012-11-20 DIAGNOSIS — E042 Nontoxic multinodular goiter: Secondary | ICD-10-CM

## 2012-11-20 NOTE — Progress Notes (Signed)
Subjective:    Patient ID: Rachel Barrett, female    DOB: 1946/03/08, 66 y.o.   MRN: [id number]  HPI Pt returns for f/u of multinodular goiter (incidentally noted on ct in 2011; bx in 2011 was benign).  pt states she feels well in general. She does not notice the goiter.   Past Medical History  Diagnosis Date  . GOITER, MULTINODULAR 02/17/2010  . HYPERTENSION 02/17/2010  . ALLERGIC RHINITIS 02/17/2010  . ASTHMA 02/17/2010  . GERD 02/17/2010  . IBS 02/17/2010  . Unspecified disorder of liver 02/22/2011  . HYPERGLYCEMIA 02/22/2011  . Fibroid   . Osteopenia     Past Surgical History  Procedure Date  . Abdominal hysterectomy 1989  . Breast surgery     Breast cyst  . Cholecystectomy   . Nose surgery   . Bladder bx 2009    History   Social History  . Marital Status: Married    Spouse Name: N/A    Number of Children: N/A  . Years of Education: N/A   Occupational History  . [employer]   Social History Main Topics  . Smoking status: Never Smoker   . Smokeless tobacco: Not on file  . Alcohol Use: No  . Drug Use: Not on file  . Sexually Active: No   Other Topics Concern  . Not on file   Social History Narrative  . No narrative on file    Current Outpatient Prescriptions on File Prior to Visit  Medication Sig Dispense Refill  . albuterol (PROAIR HFA) 108 (90 BASE) MCG/ACT inhaler Inhale 2 puffs into the lungs as needed.        Marland Kitchen amLODipine (NORVASC) 5 MG tablet Take 5 mg by mouth daily.        . budesonide-formoterol (SYMBICORT) 160-4.5 MCG/ACT inhaler Inhale 2 puffs into the lungs 2 (two) times daily as needed.        . fish oil-omega-3 fatty acids 1000 MG capsule Take 1 capsule by mouth 2 (two) times daily.        . fluticasone (FLONASE) 50 MCG/ACT nasal spray Place 2 sprays into the nose as needed.        . lansoprazole (PREVACID) 15 MG capsule Take 15 mg by mouth daily.      . potassium chloride SA (K-DUR,KLOR-CON) 20 MEQ tablet Take 20 mEq by mouth daily.         . valsartan-hydrochlorothiazide (DIOVAN-HCT) 160-25 MG per tablet Take 1 tablet by mouth daily.          Allergies  Allergen Reactions  . Augmentin (Amoxicillin-Pot Clavulanate)   . Bactrim (Sulfamethoxazole-Tmp Ds)   . Clarithromycin     Family History  Problem Relation Age of Onset  . Goiter Mother     benign  . Diabetes Neg Hx   . Hypertension Father   . Heart disease Father   . Hypertension Sister   . Hypertension Brother   . Lung cancer Brother   . Colon cancer Paternal Aunt   . Heart disease Maternal Grandmother   . Heart disease Maternal Grandfather   . Heart disease Paternal Grandmother   . Heart disease Paternal Grandfather   . Cancer Brother     Peritoneal cancer    BP 126/82  Pulse 54  Temp 97.8 F (36.6 C) (Oral)  Wt 202 lb (91.627 kg)  SpO2 93%  Review of Systems Denies weight change    Objective:   Physical Exam VITAL SIGNS:  See vs page  GENERAL: no distress NECK: There is no palpable thyroid enlargement.  No thyroid nodule is palpable.  No palpable lymphadenopathy at the anterior neck.  Lab Results  Component Value Date   TSH 0.484 11/20/2012      Assessment & Plan:  Hyperthyroidism, resolved with i-131 rx

## 2012-11-20 NOTE — Patient Instructions (Signed)
blood tests are being requested for you today.  We'll contact you with results. Please return in 1 year. most of the time, a "lumpy thyroid" will eventually become overactive.  this is usually a slow process, happening over the span of many years.

## 2012-11-21 ENCOUNTER — Telehealth: Payer: Self-pay | Admitting: *Deleted

## 2012-11-21 LAB — TSH: TSH: 0.484 u[IU]/mL (ref 0.350–4.500)

## 2012-11-21 NOTE — Telephone Encounter (Signed)
Message copied by Elnora Morrison on Tue Nov 21, 2012  8:38 AM ------      Message from: Romero Belling      Created: Tue Nov 21, 2012  7:52 AM       please call patient:      normal

## 2012-11-21 NOTE — Telephone Encounter (Signed)
PATIENT NOTIFIED OF NORMAL TSH LAB RESULT.

## 2012-11-27 ENCOUNTER — Ambulatory Visit
Admission: RE | Admit: 2012-11-27 | Discharge: 2012-11-27 | Disposition: A | Discharge status: Home / Self Care | Payer: BC Managed Care – PPO | Source: Ambulatory Visit | Attending: Obstetrics and Gynecology | Admitting: Obstetrics and Gynecology

## 2012-11-27 DIAGNOSIS — M8580 Other specified disorders of bone density and structure, unspecified site: Secondary | ICD-10-CM

## 2012-11-27 DIAGNOSIS — Z1231 Encounter for screening mammogram for malignant neoplasm of breast: Secondary | ICD-10-CM

## 2013-06-29 ENCOUNTER — Encounter: Payer: Self-pay | Admitting: Gynecology

## 2013-06-29 ENCOUNTER — Ambulatory Visit (INDEPENDENT_AMBULATORY_CARE_PROVIDER_SITE_OTHER): Payer: BC Managed Care – PPO | Admitting: Gynecology

## 2013-06-29 VITALS — BP 124/78 | Ht 65.0 in | Wt 205.0 lb

## 2013-06-29 DIAGNOSIS — Z01419 Encounter for gynecological examination (general) (routine) without abnormal findings: Secondary | ICD-10-CM

## 2013-06-29 DIAGNOSIS — N952 Postmenopausal atrophic vaginitis: Secondary | ICD-10-CM

## 2013-06-29 DIAGNOSIS — M8580 Other specified disorders of bone density and structure, unspecified site: Secondary | ICD-10-CM

## 2013-06-29 DIAGNOSIS — M899 Disorder of bone, unspecified: Secondary | ICD-10-CM

## 2013-06-29 DIAGNOSIS — M949 Disorder of cartilage, unspecified: Secondary | ICD-10-CM

## 2013-06-29 DIAGNOSIS — D251 Intramural leiomyoma of uterus: Secondary | ICD-10-CM

## 2013-06-29 NOTE — Patient Instructions (Signed)
Schedule your colonoscopy. Followup in one year for annual exam.

## 2013-06-29 NOTE — Progress Notes (Signed)
Rachel Barrett 07-31-1946 [id number]        67 y.o.  G2P1011 for annual exam.  Former patient of Dr. Verl Dicker with several issues noted below.  Past medical history,surgical history, medications, allergies, family history and social history were all reviewed and documented in the EPIC chart.  ROS:  Performed and pertinent positives and negatives are included in the history, assessment and plan .  Exam: Kim assistant Filed Vitals:   06/29/13 1605  BP: 124/78  Height: 5\' 5"  (1.651 m)  Weight: 205 lb (92.987 kg)   General appearance  Normal Skin grossly normal Head/Neck normal with no cervical or supraclavicular adenopathy thyroid normal Lungs  clear Cardiac RR, without RMG Abdominal  soft, nontender, without masses, organomegaly or hernia Breasts  examined lying and sitting without masses, retractions, discharge or axillary adenopathy. Pelvic  Ext/BUS/vagina  normal with atrophic changes   Adnexa  Without masses or tenderness    Anus and perineum  normal   Rectovaginal  normal sphincter tone without palpated masses or tenderness.    Assessment/Plan:  67 y.o. G80P1011 female for annual exam.   1. History of abdominal hysterectomy 1989. No significant menopausal symptoms such as hot flushes night sweats vaginal dryness. We'll continue to observe. 2. History of small vaginal cyst by Dr. Verl Dicker exams. Not clearly visualized today. Vagina appears normal visually and on bimanual. We'll continue to follow. 3. Osteopenia. DEXA 11/2012 with T score -1.8 at the femur. Spine not done due to degenerative changes. FRAX 6.7%/0.8%. Continue observation. Increase calcium vitamin D reviewed. Plan repeat in another year or 2. 4. Mammography 11/2012. Continue with annual mammography. 5. Pap smear 2013. No Pap smear done today. No history of significant abnormal Pap smears. Reviewed current screening guidelines. Options to stop screening altogether or less frequent screening intervals reviewed and  she is over the age of 36 and status post hysterectomy for benign indications. Will readdress on an annual basis. 6. Colonoscopy. Patient got a letter indicating she needs to schedule her colonoscopy. Patient agrees to call and schedule. 7. Health maintenance. No lab work done today as it is all done through her primary physician's office who follows her for her medical issues. Follow up one year, sooner as needed.  Note: This document was prepared with digital dictation and possible smart phrase technology. Any transcriptional errors that result from this process are unintentional.   Dara Lords MD, 4:46 PM 06/29/2013

## 2013-06-30 LAB — URINALYSIS W MICROSCOPIC + REFLEX CULTURE
Bacteria, UA: NONE SEEN
Bilirubin Urine: NEGATIVE
Casts: NONE SEEN
Crystals: NONE SEEN
Glucose, UA: NEGATIVE mg/dL
Hgb urine dipstick: NEGATIVE
Ketones, ur: NEGATIVE mg/dL
Leukocytes, UA: NEGATIVE
Nitrite: NEGATIVE
Protein, ur: NEGATIVE mg/dL
Specific Gravity, Urine: 1.015 (ref 1.005–1.030)
Squamous Epithelial / LPF: NONE SEEN
Urobilinogen, UA: 0.2 mg/dL (ref 0.0–1.0)
pH: 7 (ref 5.0–8.0)

## 2013-07-04 ENCOUNTER — Encounter: Payer: Self-pay | Admitting: Obstetrics and Gynecology

## 2013-08-20 ENCOUNTER — Ambulatory Visit (INDEPENDENT_AMBULATORY_CARE_PROVIDER_SITE_OTHER): Payer: BC Managed Care – PPO | Admitting: Internal Medicine

## 2013-08-20 VITALS — BP 150/90 | HR 53 | Temp 98.2°F | Resp 18 | Ht 64.0 in | Wt 202.0 lb

## 2013-08-20 DIAGNOSIS — R110 Nausea: Secondary | ICD-10-CM

## 2013-08-20 DIAGNOSIS — R319 Hematuria, unspecified: Secondary | ICD-10-CM

## 2013-08-20 DIAGNOSIS — M549 Dorsalgia, unspecified: Secondary | ICD-10-CM

## 2013-08-20 LAB — POCT URINALYSIS DIPSTICK
Bilirubin, UA: NEGATIVE
Blood, UA: NEGATIVE
Glucose, UA: NEGATIVE
Leukocytes, UA: NEGATIVE
Nitrite, UA: NEGATIVE
Protein, UA: NEGATIVE
Spec Grav, UA: 1.025
Urobilinogen, UA: 0.2
pH, UA: 6.5

## 2013-08-20 LAB — POCT UA - MICROSCOPIC ONLY
Crystals, Ur, HPF, POC: NEGATIVE
RBC, urine, microscopic: NEGATIVE
Yeast, UA: NEGATIVE

## 2013-08-20 MED ORDER — ONDANSETRON 4 MG PO TBDP
4.0000 mg | ORAL_TABLET | Freq: Once | ORAL | Status: AC
  Administered 2013-08-20: 4 mg via ORAL

## 2013-08-20 NOTE — Patient Instructions (Signed)
1- Our office will call Dr. Ellin Goodie office for an appointment for you. 2- We will notify you of your urine culture results. 2- Return if you have fever.

## 2013-08-20 NOTE — Progress Notes (Signed)
Subjective:    Patient ID: Rachel Barrett, female    DOB: 1946/04/25, 67 y.o.   MRN: [id number]  HPI Patient has had mid back pain since Thursday, worse since Saturday. Noticed a lot of vaginal bleeding or at least bleeding when urinating seen on TP and thought to be coming from urine rather than vag area (yesterday when wiping). Small amount spotting today. Had difficulty urinating, this improved with lemon juice. Getting up 1-2 x at night to urinate. No urgency, frequency. No injury to back, has not taken any meds for pain. No fever or chills.  Was diagnosed with inflammation of the bladder by Dr. Isabel Caprice 2 years ago. Called their office but was unable to get an appointment today. Had abdominal CT 7/13- negative.  Feels like "rocks" in stomach. Has been nauseous without vomiting 24hrs. Has been drinking lemon juice to break down calcium.Gave her some relief from stomach pain. Also taking probiotic. No appetite. Had BM yesterday morning. Takes Miralax as needed to have BM every 2-3 days.   Has gained weight over last year.  Checks BP at home, was normal prior to her feeling poorly.  Has been using inhaler for SOB, worse with current stomach symptoms.  Brother has been in hospital for nearly a year with peritoneal cancer. Is currently in ICU at [facility]. Other brother died of small cell lung cancer a little over a year ago.  Patient Active Problem List   Diagnosis Date Noted  . Fibroid   . Osteopenia   . UNSPECIFIED DISORDER OF LIVER 02/22/2011  . HYPERGLYCEMIA 02/22/2011  . GOITER, MULTINODULAR 02/17/2010  . HYPERTENSION 02/17/2010  . ALLERGIC RHINITIS 02/17/2010  . ASTHMA 02/17/2010  . GERD 02/17/2010  . IBS 02/17/2010  Current outpatient prescriptions:albuterol (PROAIR HFA) 108 (90 BASE) MCG/ACT inhaler, Inhale 2 puffs into the lungs as needed.  amLODipine (NORVASC) 5 MG tablet, Take 5 mg by mouth daily budesonide-formoterol (SYMBICORT) 160-4.5 MCG/ACT inhaler, Inhale 2 puffs into the  lungs 2 (two) times daily as needed.  fish oil-omega-3 fatty acids 1000 MG capsule, Take 1 capsule by mouth 2 (two) times daily. fluticasone (FLONASE) 50 MCG/ACT nasal spray, Place 2 sprays into the nose as needed. lansoprazole (PREVACID) 15 MG capsule, Take 15 mg by mouth daily  potassium chloride SA (K-DUR,KLOR-CON) 20 MEQ tablet, Take 20 mEq by mouth daily. Timolol Hemihydrate (BETIMOL OP), Apply to eye., Disp: , Rfl: ;   valsartan-hydrochlorothiazide (DIOVAN-HCT) 160-25 MG per tablet, Take 1 tablet by mouth daily.    Review of Systems No fever night sweats No abn wt loss No abd pain No joint swelling No hx lifting injury    Objective:   Physical Exam  Constitutional: She is oriented to person, place, and time. She appears well-developed and well-nourished.  HENT:  Right Ear: External ear normal.  Left Ear: Tympanic membrane, external ear and ear canal normal.  Mouth/Throat: Oropharynx is clear and moist.  Right TM slightly opaque.  Neck: Normal range of motion. Neck supple.  Cardiovascular: Normal rate, regular rhythm and normal heart sounds.   Pulmonary/Chest: Effort normal and breath sounds normal. No respiratory distress. She has no wheezes.  Abdominal: Soft. Bowel sounds are normal. She exhibits no distension and no mass. There is no tenderness. There is no rebound and no guarding.  Musculoskeletal: She exhibits no edema.  No CVA tenderness, but is tender to palpation over right and left flank areas.  Neurological: She is alert and oriented to person, place, and time.  Skin: Skin  is warm and dry.  Psychiatric: She has a normal mood and affect. Her behavior is normal.  BP 150/90  Pulse 53  Temp(Src) 98.2 F (36.8 C) (Oral)  Resp 18  Ht 5\' 4"  (1.626 m)  Wt 202 lb (91.627 kg)  BMI 34.66 kg/m2  SpO2 99%  Results for orders placed in visit on 08/20/13  POCT URINALYSIS DIPSTICK      Result Value Range   Color, UA yellow     Clarity, UA clear     Glucose, UA neg      Bilirubin, UA neg     Ketones, UA eng     Spec Grav, UA 1.025     Blood, UA neg     pH, UA 6.5     Protein, UA neg     Urobilinogen, UA 0.2     Nitrite, UA neg     Leukocytes, UA Negative    POCT UA - MICROSCOPIC ONLY      Result Value Range   WBC, Ur, HPF, POC 0-1     RBC, urine, microscopic neg     Bacteria, U Microscopic 2+     Mucus, UA trace     Epithelial cells, urine per micros 0-3     Crystals, Ur, HPF, POC neg     Casts, Ur, LPF, POC broad     Yeast, UA neg         Assessment & Plan:  Blood in urine/hx gross hematuria   Back pain ? associated  Nausea - Plan: ondansetron (ZOFRAN-ODT) disintegrating tablet 4 mg given in office  Patient presenting with similar symptoms from previous workup. Had normal gyn exam last month. Will facilitate her getting an appointment with Dr. Isabel Caprice. She is to return if she has fever or pain increase   Fully participated in evaluation/plan. I have reviewed and agree with documentation. Robert P. Merla Riches, M.D.

## 2013-10-30 ENCOUNTER — Other Ambulatory Visit: Payer: Self-pay

## 2013-10-30 DIAGNOSIS — Z1231 Encounter for screening mammogram for malignant neoplasm of breast: Secondary | ICD-10-CM

## 2013-11-30 ENCOUNTER — Ambulatory Visit
Admission: RE | Admit: 2013-11-30 | Discharge: 2013-11-30 | Disposition: A | Discharge status: Home / Self Care | Payer: BC Managed Care – PPO | Source: Ambulatory Visit

## 2013-11-30 DIAGNOSIS — Z1231 Encounter for screening mammogram for malignant neoplasm of breast: Secondary | ICD-10-CM

## 2014-06-11 DIAGNOSIS — H3023 Posterior cyclitis, bilateral: Secondary | ICD-10-CM | POA: Insufficient documentation

## 2014-07-01 ENCOUNTER — Encounter: Payer: Self-pay | Admitting: Gynecology

## 2014-07-01 ENCOUNTER — Ambulatory Visit (INDEPENDENT_AMBULATORY_CARE_PROVIDER_SITE_OTHER): Payer: Commercial Managed Care - HMO | Admitting: Gynecology

## 2014-07-01 VITALS — BP 118/78 | Ht 64.0 in | Wt 200.4 lb

## 2014-07-01 DIAGNOSIS — N952 Postmenopausal atrophic vaginitis: Secondary | ICD-10-CM

## 2014-07-01 DIAGNOSIS — M949 Disorder of cartilage, unspecified: Secondary | ICD-10-CM

## 2014-07-01 DIAGNOSIS — M545 Low back pain, unspecified: Secondary | ICD-10-CM

## 2014-07-01 DIAGNOSIS — M899 Disorder of bone, unspecified: Secondary | ICD-10-CM

## 2014-07-01 DIAGNOSIS — M8580 Other specified disorders of bone density and structure, unspecified site: Secondary | ICD-10-CM

## 2014-07-01 LAB — URINALYSIS W MICROSCOPIC + REFLEX CULTURE
Bilirubin Urine: NEGATIVE
Glucose, UA: NEGATIVE mg/dL
Hgb urine dipstick: NEGATIVE
Leukocytes, UA: NEGATIVE
Nitrite: NEGATIVE
Protein, ur: 30 mg/dL — AB
Specific Gravity, Urine: 1.015 (ref 1.005–1.030)
Urobilinogen, UA: 0.2 mg/dL (ref 0.0–1.0)
pH: 7 (ref 5.0–8.0)

## 2014-07-01 NOTE — Progress Notes (Signed)
Rachel Barrett December 22, 1945 [id number]        68 y.o.  G2P1011 for followup exam.  Several issues noted below  Past medical history,surgical history, problem list, medications, allergies, family history and social history were all reviewed and documented as reviewed in the EPIC chart.  ROS:  12 system ROS performed with pertinent positives and negatives included in the history, assessment and plan.   Additional significant findings :  None   Exam: Sharrie Rothman assistant Filed Vitals:   07/01/14 0954  BP: 118/78  Height: 5\' 4"  (1.626 m)  Weight: 200 lb 6.4 oz (90.901 kg)   General appearance:  Normal affect, orientation and appearance. Skin: Grossly normal HEENT: Without gross lesions.  No cervical or supraclavicular adenopathy. Thyroid normal.  Lungs:  Clear without wheezing, rales or rhonchi Cardiac: RR, without RMG Abdominal:  Soft, nontender, without masses, guarding, rebound, organomegaly or hernia Breasts:  Examined lying and sitting without masses, retractions, discharge or axillary adenopathy. Pelvic:  Ext/BUS/vagina with atrophic changes  Adnexa  Without masses or tenderness    Anus and perineum  Normal   Rectovaginal  Normal sphincter tone without palpated masses or tenderness.    Assessment/Plan:  68 y.o. G19P1011 female for followup exam.   1. Notes some low back pain low-grade fevers. Exam is normal. Urinalysis is negative. Followup with primary physician if symptoms persist/worsen. 2. Atrophic genital changes. Status post [place] for leiomyoma. Without significant symptoms of hot flushes, night sweats, vaginal dryness or dyspareunia. Will continue to monitor. 3. Osteopenia. DEXA 11/2012 with T score -1.8 at the femur. FRAX 6.7%/0.8%. Recommend repeat DEXA now a two-year interval. Increase calcium vitamin D reviewed. 4. Mammography 11/2013. Continue with annual mammography. SBE monthly reviewed. 5. Pap smear 11/2012. No Pap smear done today. No history of abnormal Pap smears  previously. Options to stop screening altogether she is status post hysterectomy for benign indications and over the age of 9 versus less frequent screening intervals reviewed. Will readdress on an annual basis. 6. Colonoscopy reported this year. Will continue at their recommended interval. 7. Health maintenance. No blood work done and she reports this done at her primary physician's office. Followup for DEXA otherwise 1 year, sooner as needed.   Note: This document was prepared with digital dictation and possible smart phrase technology. Any transcriptional errors that result from this process are unintentional.   Anastasio Auerbach MD, 10:25 AM 07/01/2014

## 2014-07-01 NOTE — Patient Instructions (Signed)
Follow up for bone density as scheduled  Follow up for annual exam in one year 

## 2014-07-13 DIAGNOSIS — M8580 Other specified disorders of bone density and structure, unspecified site: Secondary | ICD-10-CM

## 2014-07-13 HISTORY — DX: Other specified disorders of bone density and structure, unspecified site: M85.80

## 2014-07-16 ENCOUNTER — Ambulatory Visit (INDEPENDENT_AMBULATORY_CARE_PROVIDER_SITE_OTHER): Payer: Commercial Managed Care - HMO

## 2014-07-16 DIAGNOSIS — M8580 Other specified disorders of bone density and structure, unspecified site: Secondary | ICD-10-CM

## 2014-07-16 DIAGNOSIS — M949 Disorder of cartilage, unspecified: Secondary | ICD-10-CM

## 2014-07-16 DIAGNOSIS — M899 Disorder of bone, unspecified: Secondary | ICD-10-CM

## 2014-07-18 ENCOUNTER — Encounter: Payer: Self-pay | Admitting: Gynecology

## 2014-10-14 ENCOUNTER — Encounter: Payer: Self-pay | Admitting: Gynecology

## 2014-11-04 ENCOUNTER — Other Ambulatory Visit: Payer: Self-pay

## 2014-11-04 DIAGNOSIS — Z1231 Encounter for screening mammogram for malignant neoplasm of breast: Secondary | ICD-10-CM

## 2014-12-03 ENCOUNTER — Ambulatory Visit
Admission: RE | Admit: 2014-12-03 | Discharge: 2014-12-03 | Disposition: A | Discharge status: Home / Self Care | Payer: Commercial Managed Care - HMO | Source: Ambulatory Visit

## 2014-12-03 DIAGNOSIS — Z1231 Encounter for screening mammogram for malignant neoplasm of breast: Secondary | ICD-10-CM

## 2015-02-03 DIAGNOSIS — I10 Essential (primary) hypertension: Secondary | ICD-10-CM | POA: Diagnosis not present

## 2015-02-03 DIAGNOSIS — J4520 Mild intermittent asthma, uncomplicated: Secondary | ICD-10-CM | POA: Diagnosis not present

## 2015-02-03 DIAGNOSIS — Z23 Encounter for immunization: Secondary | ICD-10-CM | POA: Diagnosis not present

## 2015-02-27 DIAGNOSIS — H40053 Ocular hypertension, bilateral: Secondary | ICD-10-CM | POA: Diagnosis not present

## 2015-04-14 DIAGNOSIS — J342 Deviated nasal septum: Secondary | ICD-10-CM | POA: Diagnosis not present

## 2015-04-14 DIAGNOSIS — H6983 Other specified disorders of Eustachian tube, bilateral: Secondary | ICD-10-CM | POA: Diagnosis not present

## 2015-04-14 DIAGNOSIS — J310 Chronic rhinitis: Secondary | ICD-10-CM | POA: Diagnosis not present

## 2015-04-14 DIAGNOSIS — R42 Dizziness and giddiness: Secondary | ICD-10-CM | POA: Diagnosis not present

## 2015-04-14 DIAGNOSIS — H93293 Other abnormal auditory perceptions, bilateral: Secondary | ICD-10-CM | POA: Diagnosis not present

## 2015-05-05 DIAGNOSIS — R0602 Shortness of breath: Secondary | ICD-10-CM | POA: Diagnosis not present

## 2015-05-05 DIAGNOSIS — I10 Essential (primary) hypertension: Secondary | ICD-10-CM | POA: Diagnosis not present

## 2015-05-05 DIAGNOSIS — R0789 Other chest pain: Secondary | ICD-10-CM | POA: Diagnosis not present

## 2015-05-05 DIAGNOSIS — R42 Dizziness and giddiness: Secondary | ICD-10-CM | POA: Diagnosis not present

## 2015-05-21 DIAGNOSIS — R0602 Shortness of breath: Secondary | ICD-10-CM | POA: Diagnosis not present

## 2015-05-30 DIAGNOSIS — R0789 Other chest pain: Secondary | ICD-10-CM | POA: Diagnosis not present

## 2015-06-04 DIAGNOSIS — I10 Essential (primary) hypertension: Secondary | ICD-10-CM | POA: Diagnosis not present

## 2015-06-04 DIAGNOSIS — R42 Dizziness and giddiness: Secondary | ICD-10-CM | POA: Diagnosis not present

## 2015-06-04 DIAGNOSIS — R0789 Other chest pain: Secondary | ICD-10-CM | POA: Diagnosis not present

## 2015-06-04 DIAGNOSIS — R0602 Shortness of breath: Secondary | ICD-10-CM | POA: Diagnosis not present

## 2015-07-18 ENCOUNTER — Ambulatory Visit (INDEPENDENT_AMBULATORY_CARE_PROVIDER_SITE_OTHER): Payer: Commercial Managed Care - HMO | Admitting: Gynecology

## 2015-07-18 ENCOUNTER — Encounter: Payer: Self-pay | Admitting: Gynecology

## 2015-07-18 ENCOUNTER — Other Ambulatory Visit (HOSPITAL_COMMUNITY)
Admission: RE | Admit: 2015-07-18 | Discharge: 2015-07-18 | Disposition: A | Discharge status: Home / Self Care | Payer: Commercial Managed Care - HMO | Source: Ambulatory Visit | Attending: Gynecology | Admitting: Gynecology

## 2015-07-18 VITALS — BP 124/76 | Ht 63.5 in | Wt 203.0 lb

## 2015-07-18 DIAGNOSIS — Z1272 Encounter for screening for malignant neoplasm of vagina: Secondary | ICD-10-CM

## 2015-07-18 DIAGNOSIS — Z01419 Encounter for gynecological examination (general) (routine) without abnormal findings: Secondary | ICD-10-CM

## 2015-07-18 DIAGNOSIS — N952 Postmenopausal atrophic vaginitis: Secondary | ICD-10-CM | POA: Diagnosis not present

## 2015-07-18 DIAGNOSIS — M8580 Other specified disorders of bone density and structure, unspecified site: Secondary | ICD-10-CM | POA: Diagnosis not present

## 2015-07-18 DIAGNOSIS — Z124 Encounter for screening for malignant neoplasm of cervix: Secondary | ICD-10-CM | POA: Insufficient documentation

## 2015-07-18 NOTE — Patient Instructions (Signed)
You may obtain a copy of any labs that were done today by logging onto MyChart as outlined in the instructions provided with your AVS (after visit summary). The office will not call with normal lab results but certainly if there are any significant abnormalities then we will contact you.   Health Maintenance, Female A healthy lifestyle and preventative care can promote health and wellness.  Maintain regular health, dental, and eye exams.  Eat a healthy diet. Foods like vegetables, fruits, whole grains, low-fat dairy products, and lean protein foods contain the nutrients you need without too many calories. Decrease your intake of foods high in solid fats, added sugars, and salt. Get information about a proper diet from your caregiver, if necessary.  Regular physical exercise is one of the most important things you can do for your health. Most adults should get at least 150 minutes of moderate-intensity exercise (any activity that increases your heart rate and causes you to sweat) each week. In addition, most adults need muscle-strengthening exercises on 2 or more days a week.   Maintain a healthy weight. The body mass index (BMI) is a screening tool to identify possible weight problems. It provides an estimate of body fat based on height and weight. Your caregiver can help determine your BMI, and can help you achieve or maintain a healthy weight. For adults 20 years and older:  A BMI below 18.5 is considered underweight.  A BMI of 18.5 to 24.9 is normal.  A BMI of 25 to 29.9 is considered overweight.  A BMI of 30 and above is considered obese.  Maintain normal blood lipids and cholesterol by exercising and minimizing your intake of saturated fat. Eat a balanced diet with plenty of fruits and vegetables. Blood tests for lipids and cholesterol should begin at age 61 and be repeated every 5 years. If your lipid or cholesterol levels are high, you are over 50, or you are a high risk for heart  disease, you may need your cholesterol levels checked more frequently.Ongoing high lipid and cholesterol levels should be treated with medicines if diet and exercise are not effective.  If you smoke, find out from your caregiver how to quit. If you do not use tobacco, do not start.  Lung cancer screening is recommended for adults aged 33 80 years who are at high risk for developing lung cancer because of a history of smoking. Yearly low-dose computed tomography (CT) is recommended for people who have at least a 30-pack-year history of smoking and are a current smoker or have quit within the past 15 years. A pack year of smoking is smoking an average of 1 pack of cigarettes a day for 1 year (for example: 1 pack a day for 30 years or 2 packs a day for 15 years). Yearly screening should continue until the smoker has stopped smoking for at least 15 years. Yearly screening should also be stopped for people who develop a health problem that would prevent them from having lung cancer treatment.  If you are pregnant, do not drink alcohol. If you are breastfeeding, be very cautious about drinking alcohol. If you are not pregnant and choose to drink alcohol, do not exceed 1 drink per day. One drink is considered to be 12 ounces (355 mL) of beer, 5 ounces (148 mL) of wine, or 1.5 ounces (44 mL) of liquor.  Avoid use of street drugs. Do not share needles with anyone. Ask for help if you need support or instructions about stopping  the use of drugs.  High blood pressure causes heart disease and increases the risk of stroke. Blood pressure should be checked at least every 1 to 2 years. Ongoing high blood pressure should be treated with medicines, if weight loss and exercise are not effective.  If you are 59 to 68 years old, ask your caregiver if you should take aspirin to prevent strokes.  Diabetes screening involves taking a blood sample to check your fasting blood sugar level. This should be done once every 3  years, after age 91, if you are within normal weight and without risk factors for diabetes. Testing should be considered at a younger age or be carried out more frequently if you are overweight and have at least 1 risk factor for diabetes.  Breast cancer screening is essential preventative care for women. You should practice "breast self-awareness." This means understanding the normal appearance and feel of your breasts and may include breast self-examination. Any changes detected, no matter how small, should be reported to a caregiver. Women in their 66s and 30s should have a clinical breast exam (CBE) by a caregiver as part of a regular health exam every 1 to 3 years. After age 101, women should have a CBE every year. Starting at age 100, women should consider having a mammogram (breast X-ray) every year. Women who have a family history of breast cancer should talk to their caregiver about genetic screening. Women at a high risk of breast cancer should talk to their caregiver about having an MRI and a mammogram every year.  Breast cancer gene (BRCA)-related cancer risk assessment is recommended for women who have family members with BRCA-related cancers. BRCA-related cancers include breast, ovarian, tubal, and peritoneal cancers. Having family members with these cancers may be associated with an increased risk for harmful changes (mutations) in the breast cancer genes BRCA1 and BRCA2. Results of the assessment will determine the need for genetic counseling and BRCA1 and BRCA2 testing.  The Pap test is a screening test for cervical cancer. Women should have a Pap test starting at age 57. Between ages 25 and 35, Pap tests should be repeated every 2 years. Beginning at age 37, you should have a Pap test every 3 years as long as the past 3 Pap tests have been normal. If you had a hysterectomy for a problem that was not cancer or a condition that could lead to cancer, then you no longer need Pap tests. If you are  between ages 50 and 76, and you have had normal Pap tests going back 10 years, you no longer need Pap tests. If you have had past treatment for cervical cancer or a condition that could lead to cancer, you need Pap tests and screening for cancer for at least 20 years after your treatment. If Pap tests have been discontinued, risk factors (such as a new sexual partner) need to be reassessed to determine if screening should be resumed. Some women have medical problems that increase the chance of getting cervical cancer. In these cases, your caregiver may recommend more frequent screening and Pap tests.  The human papillomavirus (HPV) test is an additional test that may be used for cervical cancer screening. The HPV test looks for the virus that can cause the cell changes on the cervix. The cells collected during the Pap test can be tested for HPV. The HPV test could be used to screen women aged 44 years and older, and should be used in women of any age  who have unclear Pap test results. After the age of 52, women should have HPV testing at the same frequency as a Pap test.  Colorectal cancer can be detected and often prevented. Most routine colorectal cancer screening begins at the age of 17 and continues through age 81. However, your caregiver may recommend screening at an earlier age if you have risk factors for colon cancer. On a yearly basis, your caregiver may provide home test kits to check for hidden blood in the stool. Use of a small camera at the end of a tube, to directly examine the colon (sigmoidoscopy or colonoscopy), can detect the earliest forms of colorectal cancer. Talk to your caregiver about this at age 99, when routine screening begins. Direct examination of the colon should be repeated every 5 to 10 years through age 14, unless early forms of pre-cancerous polyps or small growths are found.  Hepatitis C blood testing is recommended for all people born from [birth year] through 1964 and any  individual with known risks for hepatitis C.  Practice safe sex. Use condoms and avoid high-risk sexual practices to reduce the spread of sexually transmitted infections (STIs). Sexually active women aged 41 and younger should be checked for Chlamydia, which is a common sexually transmitted infection. Older women with new or multiple partners should also be tested for Chlamydia. Testing for other STIs is recommended if you are sexually active and at increased risk.  Osteoporosis is a disease in which the bones lose minerals and strength with aging. This can result in serious bone fractures. The risk of osteoporosis can be identified using a bone density scan. Women ages 69 and over and women at risk for fractures or osteoporosis should discuss screening with their caregivers. Ask your caregiver whether you should be taking a calcium supplement or vitamin D to reduce the rate of osteoporosis.  Menopause can be associated with physical symptoms and risks. Hormone replacement therapy is available to decrease symptoms and risks. You should talk to your caregiver about whether hormone replacement therapy is right for you.  Use sunscreen. Apply sunscreen liberally and repeatedly throughout the day. You should seek shade when your shadow is shorter than you. Protect yourself by wearing long sleeves, pants, a wide-brimmed hat, and sunglasses year round, whenever you are outdoors.  Notify your caregiver of new moles or changes in moles, especially if there is a change in shape or color. Also notify your caregiver if a mole is larger than the size of a pencil eraser.  Stay current with your immunizations. Document Released: 06/14/2011 Document Revised: 03/26/2013 Document Reviewed: 06/14/2011 [facility] Patient Information 2014 [place].

## 2015-07-18 NOTE — Addendum Note (Signed)
Addended by: Nelva Nay on: 07/18/2015 03:56 PM   Modules accepted: Orders

## 2015-07-18 NOTE — Progress Notes (Signed)
Rachel Barrett October 31, 1946 [id number]        69 y.o.  G2P1011 for breast and pelvic exam  Past medical history,surgical history, problem list, medications, allergies, family history and social history were all reviewed and documented as reviewed in the EPIC chart.  ROS:  Performed with pertinent positives and negatives included in the history, assessment and plan.   Additional significant findings :  none   Exam: Kim Counsellor Vitals:   07/18/15 1533  BP: 124/76  Height: 5' 3.5" (1.613 m)  Weight: 203 lb (92.08 kg)   General appearance:  Normal affect, orientation and appearance. Skin: Grossly normal HEENT: Without gross lesions.  No cervical or supraclavicular adenopathy. Thyroid normal.  Lungs:  Clear without wheezing, rales or rhonchi Cardiac: RR, without RMG Abdominal:  Soft, nontender, without masses, guarding, rebound, organomegaly or hernia Breasts:  Examined lying and sitting without masses, retractions, discharge or axillary adenopathy. Pelvic:  Ext/BUS/vagina with generalized atrophic changes. Pap smear of cuff done  Adnexa  Without masses or tenderness    Anus and perineum  Normal   Rectovaginal  Normal sphincter tone without palpated masses or tenderness.    Assessment/Plan:  69 y.o. G74P1011 female for breast and pelvic exam.   1. Post deposits/atrophic genital changes. Status post [place] for leiomyoma. Without significant hot flushes, night sweats, vaginal dryness. Continue to monitor and report any issues. 2. Osteopenia. DEXA 07/2014 T score -1.8 FRAX 4%/0.6%. Increase calcium vitamin D reviewed. Repeat DEXA next year to year interval. 3. Mammography 11/2014. Continue with annual mammography when due. SBE monthly reviewed. 4. Colonoscopy 2015. Repeat at their recommended interval. 5. Pap smear 2013. Pap smear of vaginal cuff done today. Options to stop screening altogether reviewed as she is status post hysterectomy for benign indications and over the age of  69. Will readdress on an annual basis. 6. Health maintenance. No routine blood work done as this is done at her primary physician's office. Follow up 1 year, sooner as needed.   Anastasio Auerbach MD, 3:51 PM 07/18/2015

## 2015-07-23 LAB — CYTOLOGY - PAP

## 2015-07-24 ENCOUNTER — Encounter: Payer: Commercial Managed Care - HMO | Admitting: Gynecology

## 2015-08-07 DIAGNOSIS — Z23 Encounter for immunization: Secondary | ICD-10-CM | POA: Diagnosis not present

## 2015-08-07 DIAGNOSIS — Z1389 Encounter for screening for other disorder: Secondary | ICD-10-CM | POA: Diagnosis not present

## 2015-08-07 DIAGNOSIS — E042 Nontoxic multinodular goiter: Secondary | ICD-10-CM | POA: Diagnosis not present

## 2015-08-07 DIAGNOSIS — Z0000 Encounter for general adult medical examination without abnormal findings: Secondary | ICD-10-CM | POA: Diagnosis not present

## 2015-08-07 DIAGNOSIS — E669 Obesity, unspecified: Secondary | ICD-10-CM | POA: Diagnosis not present

## 2015-08-07 DIAGNOSIS — I10 Essential (primary) hypertension: Secondary | ICD-10-CM | POA: Diagnosis not present

## 2015-08-07 DIAGNOSIS — J4520 Mild intermittent asthma, uncomplicated: Secondary | ICD-10-CM | POA: Diagnosis not present

## 2015-08-07 DIAGNOSIS — Z6835 Body mass index (BMI) 35.0-35.9, adult: Secondary | ICD-10-CM | POA: Diagnosis not present

## 2015-09-04 DIAGNOSIS — H40053 Ocular hypertension, bilateral: Secondary | ICD-10-CM | POA: Diagnosis not present

## 2015-11-03 ENCOUNTER — Other Ambulatory Visit: Payer: Self-pay

## 2015-11-03 DIAGNOSIS — Z1231 Encounter for screening mammogram for malignant neoplasm of breast: Secondary | ICD-10-CM

## 2015-12-09 ENCOUNTER — Ambulatory Visit
Admission: RE | Admit: 2015-12-09 | Discharge: 2015-12-09 | Disposition: A | Discharge status: Home / Self Care | Payer: Commercial Managed Care - HMO | Source: Ambulatory Visit

## 2015-12-09 DIAGNOSIS — Z1231 Encounter for screening mammogram for malignant neoplasm of breast: Secondary | ICD-10-CM

## 2015-12-12 DIAGNOSIS — Z0100 Encounter for examination of eyes and vision without abnormal findings: Secondary | ICD-10-CM | POA: Diagnosis not present

## 2016-01-05 ENCOUNTER — Ambulatory Visit (INDEPENDENT_AMBULATORY_CARE_PROVIDER_SITE_OTHER): Payer: Commercial Managed Care - HMO | Admitting: Family Medicine

## 2016-01-05 VITALS — BP 130/72 | HR 71 | Temp 98.5°F | Resp 20 | Ht 63.39 in | Wt 200.0 lb

## 2016-01-05 DIAGNOSIS — L304 Erythema intertrigo: Secondary | ICD-10-CM

## 2016-01-05 DIAGNOSIS — R0981 Nasal congestion: Secondary | ICD-10-CM

## 2016-01-05 DIAGNOSIS — R42 Dizziness and giddiness: Secondary | ICD-10-CM

## 2016-01-05 DIAGNOSIS — R200 Anesthesia of skin: Secondary | ICD-10-CM

## 2016-01-05 DIAGNOSIS — R209 Unspecified disturbances of skin sensation: Secondary | ICD-10-CM | POA: Diagnosis not present

## 2016-01-05 DIAGNOSIS — R202 Paresthesia of skin: Secondary | ICD-10-CM | POA: Diagnosis not present

## 2016-01-05 LAB — POCT CBC
Granulocyte percent: 65.1 %G (ref 37–80)
HCT, POC: 40.7 % (ref 37.7–47.9)
Hemoglobin: 13.8 g/dL (ref 12.2–16.2)
Lymph, poc: 2 (ref 0.6–3.4)
MCH, POC: 29.1 pg (ref 27–31.2)
MCHC: 33.9 g/dL (ref 31.8–35.4)
MCV: 86 fL (ref 80–97)
MID (cbc): 0.2 (ref 0–0.9)
MPV: 7.7 fL (ref 0–99.8)
POC Granulocyte: 4 (ref 2–6.9)
POC LYMPH PERCENT: 31.7 %L (ref 10–50)
POC MID %: 3.2 %M (ref 0–12)
Platelet Count, POC: 199 10*3/uL (ref 142–424)
RBC: 4.73 M/uL (ref 4.04–5.48)
RDW, POC: 14.2 %
WBC: 6.2 10*3/uL (ref 4.6–10.2)

## 2016-01-05 LAB — POCT GLYCOSYLATED HEMOGLOBIN (HGB A1C): Hemoglobin A1C: 5.9

## 2016-01-05 LAB — GLUCOSE, POCT (MANUAL RESULT ENTRY): POC Glucose: 106 mg/dl — AB (ref 70–99)

## 2016-01-05 MED ORDER — FLUCONAZOLE 50 MG PO TABS: ORAL_TABLET | ORAL | Status: DC

## 2016-01-05 NOTE — Patient Instructions (Addendum)
Use over-the-counter Lotrimin (clotrimazole) cream twice daily on the rash  Take Diflucan 100 mg daily for 5 days  Return if worse or not improving  I recommend that you go back to see Dr. Ernesto Rutherford regarding your ear and dizziness since I do not see anything obvious there  You can take over-the-counter meclizine 25 mg 1/2-1 tablet 3 times daily if needed for bad dizziness  Return at anytime if necessary let you K

## 2016-01-05 NOTE — Progress Notes (Signed)
Patient ID: Rachel Barrett, female    DOB: 1946/10/03  Age: 70 y.o. MRN: [id number]  Chief Complaint  Patient presents with  . Herpes Zoster    poss. in the chest  . Shortness of Breath    Subjective:   Patient is here with a rash on her chest. She is worried about having shingles. She says it looks like some of the stuff she is seen on television. It started with some little red bumps and then has spread. She used cortisone cream on it and it got worse.  She has shortness of breath that is chronic. She has a history of asthma and uses her inhalers. She also has been having problems with some dizziness in the right ear and right side of her head. She says she's had 3 nose surgeries and ever since then has had problems with fluid in her right ear and this dizziness.   Current allergies, medications, problem list, past/family and social histories reviewed.  Objective:  BP 130/72 mmHg  Pulse 71  Temp(Src) 98.5 F (36.9 C) (Oral)  Resp 20  Ht 5' 3.39" (1.61 m)  Wt 200 lb (90.719 kg)  BMI 35.00 kg/m2  SpO2 98%  No major acute distress. TMs appear normal. No carotid bruits. Chest clear. Heart regular without murmurs. Abdomen soft without mass or tenderness.  Assessment & Plan:   Assessment: 1. Intertrigo   2. Bilateral numbness and tingling of arms and legs   3. Dizziness   4. Nasal congestion       Plan:  See instructions  Results for orders placed or performed in visit on 01/05/16  POCT CBC  Result Value Ref Range   WBC 6.2 4.6 - 10.2 K/uL   Lymph, poc 2.0 0.6 - 3.4   POC LYMPH PERCENT 31.7 10 - 50 %L   MID (cbc) 0.2 0 - 0.9   POC MID % 3.2 0 - 12 %M   POC Granulocyte 4.0 2 - 6.9   Granulocyte percent 65.1 37 - 80 %G   RBC 4.73 4.04 - 5.48 M/uL   Hemoglobin 13.8 12.2 - 16.2 g/dL   HCT, POC 40.7 37.7 - 47.9 %   MCV 86.0 80 - 97 fL   MCH, POC 29.1 27 - 31.2 pg   MCHC 33.9 31.8 - 35.4 g/dL   RDW, POC 14.2 %   Platelet Count, POC 199 142 - 424 K/uL   MPV 7.7 0 -  99.8 fL  POCT glucose (manual entry)  Result Value Ref Range   POC Glucose 106 (A) 70 - 99 mg/dl  POCT glycosylated hemoglobin (Hb A1C)  Result Value Ref Range   Hemoglobin A1C 5.9     Orders Placed This Encounter  Procedures  . COMPLETE METABOLIC PANEL WITH GFR  . POCT CBC  . POCT glucose (manual entry)  . POCT glycosylated hemoglobin (Hb A1C)    Meds ordered this encounter  Medications  . fluconazole (DIFLUCAN) 50 MG tablet    Sig: Take 2 tablets daily for 2 days, then 1 daily for 3 days    Dispense:  7 tablet    Refill:  0         Patient Instructions  Use over-the-counter Lotrimin (clotrimazole) cream twice daily on the rash  Take Diflucan 100 mg daily for 5 days  Return if worse or not improving  I recommend that you go back to see Dr. Ernesto Rutherford regarding your ear and dizziness since I do not see anything obvious  there  You can take over-the-counter meclizine 25 mg 1/2-1 tablet 3 times daily if needed for bad dizziness  Return at anytime if necessary     Return if symptoms worsen or fail to improve.   Aidyn Sportsman, MD 01/05/2016

## 2016-01-06 LAB — COMPLETE METABOLIC PANEL WITH GFR
ALT: 20 U/L (ref 6–29)
AST: 17 U/L (ref 10–35)
Albumin: 4.2 g/dL (ref 3.6–5.1)
Alkaline Phosphatase: 56 U/L (ref 33–130)
BUN: 19 mg/dL (ref 7–25)
CO2: 25 mmol/L (ref 20–31)
Calcium: 9.6 mg/dL (ref 8.6–10.4)
Chloride: 104 mmol/L (ref 98–110)
Creat: 0.9 mg/dL (ref 0.50–0.99)
GFR, Est African American: 76 mL/min (ref >=60)
GFR, Est Non African American: 66 mL/min (ref >=60)
Glucose, Bld: 97 mg/dL (ref 65–99)
Potassium: 3.8 mmol/L (ref 3.5–5.3)
Sodium: 140 mmol/L (ref 135–146)
Total Bilirubin: 0.4 mg/dL (ref 0.2–1.2)
Total Protein: 6.4 g/dL (ref 6.1–8.1)

## 2016-01-08 ENCOUNTER — Encounter: Payer: Self-pay | Admitting: *Deleted

## 2016-02-09 DIAGNOSIS — I10 Essential (primary) hypertension: Secondary | ICD-10-CM | POA: Diagnosis not present

## 2016-03-22 DIAGNOSIS — H40052 Ocular hypertension, left eye: Secondary | ICD-10-CM | POA: Diagnosis not present

## 2016-03-22 DIAGNOSIS — H40051 Ocular hypertension, right eye: Secondary | ICD-10-CM | POA: Diagnosis not present

## 2016-07-30 ENCOUNTER — Encounter: Payer: Self-pay | Admitting: Gynecology

## 2016-07-30 ENCOUNTER — Ambulatory Visit (INDEPENDENT_AMBULATORY_CARE_PROVIDER_SITE_OTHER): Payer: Commercial Managed Care - HMO | Admitting: Gynecology

## 2016-07-30 VITALS — BP 120/76 | Ht 64.0 in | Wt 200.0 lb

## 2016-07-30 DIAGNOSIS — M8580 Other specified disorders of bone density and structure, unspecified site: Secondary | ICD-10-CM

## 2016-07-30 DIAGNOSIS — N952 Postmenopausal atrophic vaginitis: Secondary | ICD-10-CM

## 2016-07-30 DIAGNOSIS — Z01419 Encounter for gynecological examination (general) (routine) without abnormal findings: Secondary | ICD-10-CM

## 2016-07-30 NOTE — Patient Instructions (Addendum)
Followup for bone density as scheduled. Followup in one year for annual exam 

## 2016-07-30 NOTE — Progress Notes (Signed)
    Rachel Barrett 16-May-1946 [id number]        70 y.o.  G2P1011  for breast and pelvic exam  Past medical history,surgical history, problem list, medications, allergies, family history and social history were all reviewed and documented as reviewed in the EPIC chart.  ROS:  Performed with pertinent positives and negatives included in the history, assessment and plan.   Additional significant findings :  None   Exam: [name] assistant Vitals:   07/30/16 1101  BP: 120/76  Weight: 200 lb (90.7 kg)  Height: 5\' 4"  (1.626 m)   Body mass index is 34.33 kg/m.  General appearance:  Normal affect, orientation and appearance. Skin: Grossly normal HEENT: Without gross lesions.  No cervical or supraclavicular adenopathy. Thyroid normal.  Lungs:  Clear without wheezing, rales or rhonchi Cardiac: RR, without RMG Abdominal:  Soft, nontender, without masses, guarding, rebound, organomegaly or hernia Breasts:  Examined lying and sitting without masses, retractions, discharge or axillary adenopathy. Pelvic:  Ext/BUS/Vagina With atrophic changes  Adnexa without masses or tenderness    Anus and perineum normal   Rectovaginal normal sphincter tone without palpated masses or tenderness.    Assessment/Plan:  70 y.o. G64P1011 female for breast and pelvic exam.  1. Postmenopausal/atrophic genital changes. TAH 1989 for leiomyoma. Without significant hot flushes, night sweats or vaginal dryness. Continue monitor and report any issues. 2. Osteopenia. DEXA 07/2014 T score -1.8 FRAX 4%/0.6%. Schedule DEXA now it to year interval and she will follow up for this. 3. Mammography 11/2015. Continue with annual mammography when due. SBE monthly reviewed. 4. Colonoscopy 2015. Repeat at their recommended interval. 5. Pap smear 2016. No Pap smear done today. No history of significant abnormal Pap smears. Options to stop screening per current screening guidelines based on age and hysterectomy history reviewed.  Will readdress on annual basis. 6. Health maintenance. No routine lab work done as this is done at Dr. Delene Ruffini office.  Follow up 1 year, sooner as needed.    Anastasio Auerbach MD, 11:19 AM 07/30/2016

## 2016-08-06 DIAGNOSIS — I10 Essential (primary) hypertension: Secondary | ICD-10-CM | POA: Diagnosis not present

## 2016-08-06 DIAGNOSIS — J4520 Mild intermittent asthma, uncomplicated: Secondary | ICD-10-CM | POA: Diagnosis not present

## 2016-08-06 DIAGNOSIS — Z0000 Encounter for general adult medical examination without abnormal findings: Secondary | ICD-10-CM | POA: Diagnosis not present

## 2016-08-06 DIAGNOSIS — R413 Other amnesia: Secondary | ICD-10-CM | POA: Diagnosis not present

## 2016-08-06 DIAGNOSIS — Z6834 Body mass index (BMI) 34.0-34.9, adult: Secondary | ICD-10-CM | POA: Diagnosis not present

## 2016-08-06 DIAGNOSIS — Z1389 Encounter for screening for other disorder: Secondary | ICD-10-CM | POA: Diagnosis not present

## 2016-08-06 DIAGNOSIS — R4689 Other symptoms and signs involving appearance and behavior: Secondary | ICD-10-CM | POA: Diagnosis not present

## 2016-08-31 ENCOUNTER — Other Ambulatory Visit: Payer: Self-pay | Admitting: Gynecology

## 2016-08-31 ENCOUNTER — Ambulatory Visit (INDEPENDENT_AMBULATORY_CARE_PROVIDER_SITE_OTHER): Payer: Commercial Managed Care - HMO

## 2016-08-31 DIAGNOSIS — M899 Disorder of bone, unspecified: Secondary | ICD-10-CM | POA: Diagnosis not present

## 2016-08-31 DIAGNOSIS — M8580 Other specified disorders of bone density and structure, unspecified site: Secondary | ICD-10-CM

## 2016-09-01 ENCOUNTER — Encounter: Payer: Self-pay | Admitting: Gynecology

## 2016-09-24 DIAGNOSIS — H40051 Ocular hypertension, right eye: Secondary | ICD-10-CM | POA: Diagnosis not present

## 2016-09-24 DIAGNOSIS — H2513 Age-related nuclear cataract, bilateral: Secondary | ICD-10-CM | POA: Diagnosis not present

## 2016-11-10 ENCOUNTER — Other Ambulatory Visit: Payer: Self-pay | Admitting: Gynecology

## 2016-11-10 DIAGNOSIS — Z1231 Encounter for screening mammogram for malignant neoplasm of breast: Secondary | ICD-10-CM

## 2016-12-21 ENCOUNTER — Ambulatory Visit: Payer: Commercial Managed Care - HMO

## 2016-12-31 ENCOUNTER — Ambulatory Visit: Payer: Commercial Managed Care - HMO

## 2017-01-28 ENCOUNTER — Ambulatory Visit
Admission: RE | Admit: 2017-01-28 | Discharge: 2017-01-28 | Disposition: A | Discharge status: Home / Self Care | Payer: Commercial Managed Care - HMO | Source: Ambulatory Visit | Attending: Gynecology | Admitting: Gynecology

## 2017-01-28 DIAGNOSIS — Z1231 Encounter for screening mammogram for malignant neoplasm of breast: Secondary | ICD-10-CM | POA: Diagnosis not present

## 2017-08-11 ENCOUNTER — Encounter: Payer: Self-pay | Admitting: Gynecology

## 2017-08-11 ENCOUNTER — Ambulatory Visit (INDEPENDENT_AMBULATORY_CARE_PROVIDER_SITE_OTHER): Payer: Medicare HMO | Admitting: Gynecology

## 2017-08-11 VITALS — BP 134/80 | Ht 64.0 in | Wt 189.0 lb

## 2017-08-11 DIAGNOSIS — N952 Postmenopausal atrophic vaginitis: Secondary | ICD-10-CM

## 2017-08-11 DIAGNOSIS — Z01411 Encounter for gynecological examination (general) (routine) with abnormal findings: Secondary | ICD-10-CM

## 2017-08-11 DIAGNOSIS — M8580 Other specified disorders of bone density and structure, unspecified site: Secondary | ICD-10-CM

## 2017-08-11 NOTE — Patient Instructions (Addendum)
Check to see when you are due for your colonoscopy with Dr Cristina Gong

## 2017-08-11 NOTE — Progress Notes (Signed)
    Rachel Barrett Sep 21, 1946 [id number]        71 y.o.  G2P1011 for breast and pelvic exam  Past medical history,surgical history, problem list, medications, allergies, family history and social history were all reviewed and documented as reviewed in the EPIC chart.  ROS:  Performed with pertinent positives and negatives included in the history, assessment and plan.   Additional significant findings :  None   Exam: [name] assistant Vitals:   08/11/17 0850  BP: 134/80  Weight: 189 lb (85.7 kg)  Height: 5\' 4"  (1.626 m)   Body mass index is 32.44 kg/m.  General appearance:  Normal affect, orientation and appearance. Skin: Grossly normal HEENT: Without gross lesions.  No cervical or supraclavicular adenopathy. Thyroid normal.  Lungs:  Clear without wheezing, rales or rhonchi Cardiac: RR, without RMG Abdominal:  Soft, nontender, without masses, guarding, rebound, organomegaly or hernia Breasts:  Examined lying and sitting without masses, retractions, discharge or axillary adenopathy. Pelvic:  Ext, BUS, Vagina: With atrophic changes  Adnexa: Without masses or tenderness    Anus and perineum: Normal   Rectovaginal: Normal sphincter tone without palpated masses or tenderness.    Assessment/Plan:  71 y.o. G25P1011 female for breast and pelvic exam.   1. Postmenopausal/atrophic genital changes. Status post [place] for leiomyoma. No significant hot flushes, night sweats or vaginal dryness. 2. Osteopenia. DEXA 08/2016 T score -1.9 FRAX 7.7%/1.2% stable from prior DEXA. Plan repeat DEXA in several years. 3. Pap smear 2016. No Pap smear done today. No history of abnormal Pap smears. Options to stop screening per current screening guidelines reviewed. 4. Mammography 01/2017. Continue with annual mammography when due. Breast exam normal today. 5. Some confusion about colonoscopy. Listed last year at 69 but now she thinks it's much longer in 2008. She's going to check with Dr. Cristina Gong  to see when she is due and arrange it if due now. 6. Health maintenance. No routine lab work done. She is seeing Dr. Laurann Montana tomorrow. Follow up in one year, sooner as needed.   Anastasio Auerbach MD, 9:13 AM 08/11/2017

## 2017-08-16 DIAGNOSIS — Z1389 Encounter for screening for other disorder: Secondary | ICD-10-CM | POA: Diagnosis not present

## 2017-08-16 DIAGNOSIS — E669 Obesity, unspecified: Secondary | ICD-10-CM | POA: Diagnosis not present

## 2017-08-16 DIAGNOSIS — Z0000 Encounter for general adult medical examination without abnormal findings: Secondary | ICD-10-CM | POA: Diagnosis not present

## 2017-08-16 DIAGNOSIS — Z6832 Body mass index (BMI) 32.0-32.9, adult: Secondary | ICD-10-CM | POA: Diagnosis not present

## 2017-08-16 DIAGNOSIS — I10 Essential (primary) hypertension: Secondary | ICD-10-CM | POA: Diagnosis not present

## 2017-09-08 DIAGNOSIS — H40051 Ocular hypertension, right eye: Secondary | ICD-10-CM | POA: Diagnosis not present

## 2017-12-02 ENCOUNTER — Other Ambulatory Visit: Payer: Self-pay | Admitting: Gynecology

## 2017-12-02 DIAGNOSIS — Z1231 Encounter for screening mammogram for malignant neoplasm of breast: Secondary | ICD-10-CM

## 2017-12-15 DIAGNOSIS — H40051 Ocular hypertension, right eye: Secondary | ICD-10-CM | POA: Diagnosis not present

## 2018-01-31 ENCOUNTER — Ambulatory Visit
Admission: RE | Admit: 2018-01-31 | Discharge: 2018-01-31 | Disposition: A | Discharge status: Home / Self Care | Payer: Medicare HMO | Source: Ambulatory Visit | Attending: Gynecology | Admitting: Gynecology

## 2018-01-31 DIAGNOSIS — Z1231 Encounter for screening mammogram for malignant neoplasm of breast: Secondary | ICD-10-CM

## 2018-08-17 ENCOUNTER — Encounter: Payer: Self-pay | Admitting: Gynecology

## 2018-08-17 ENCOUNTER — Ambulatory Visit (INDEPENDENT_AMBULATORY_CARE_PROVIDER_SITE_OTHER): Payer: Medicare HMO | Admitting: Gynecology

## 2018-08-17 VITALS — BP 124/82 | Ht 64.0 in | Wt 188.0 lb

## 2018-08-17 DIAGNOSIS — Z1272 Encounter for screening for malignant neoplasm of vagina: Secondary | ICD-10-CM

## 2018-08-17 DIAGNOSIS — Z01419 Encounter for gynecological examination (general) (routine) without abnormal findings: Secondary | ICD-10-CM | POA: Diagnosis not present

## 2018-08-17 DIAGNOSIS — M8580 Other specified disorders of bone density and structure, unspecified site: Secondary | ICD-10-CM

## 2018-08-17 DIAGNOSIS — N952 Postmenopausal atrophic vaginitis: Secondary | ICD-10-CM

## 2018-08-17 NOTE — Addendum Note (Signed)
Addended by: Nelva Nay on: 08/17/2018 11:24 AM   Modules accepted: Orders

## 2018-08-17 NOTE — Progress Notes (Signed)
    Rachel Barrett 20-May-1946 [id number]        72 y.o.  G2P1011 for breast and pelvic exam.  Without gynecologic complaints  Past medical history,surgical history, problem list, medications, allergies, family history and social history were all reviewed and documented as reviewed in the EPIC chart.  ROS:  Performed with pertinent positives and negatives included in the history, assessment and plan.   Additional significant findings : None   Exam: [name] assistant Vitals:   08/17/18 1001  BP: 124/82  Weight: 188 lb (85.3 kg)  Height: 5\' 4"  (1.626 m)   Body mass index is 32.27 kg/m.  General appearance:  Normal affect, orientation and appearance. Skin: Grossly normal HEENT: Without gross lesions.  No cervical or supraclavicular adenopathy. Thyroid normal.  Lungs:  Clear without wheezing, rales or rhonchi Cardiac: RR, without RMG Abdominal:  Soft, nontender, without masses, guarding, rebound, organomegaly or hernia Breasts:  Examined lying and sitting without masses, retractions, discharge or axillary adenopathy. Pelvic:  Ext, BUS, Vagina: With atrophic changes.  Pap smear of vaginal cuff done  Adnexa: Without masses or tenderness    Anus and perineum: Normal   Rectovaginal: Normal sphincter tone without palpated masses or tenderness.    Assessment/Plan:  72 y.o. G61P1011 female for breast and pelvic exam.   1. Postmenopausal/atrophic genital changes.  Status post [place] for leiomyoma 1889.  No significant menopausal symptoms. 2. Osteopenia.  DEXA 2017 T score -1.9.  FRAX 7% / 1%.  Recommend follow-up DEXA now at 2-year interval and she will schedule in follow-up for this. 3. Mammography 01/2018.  Continue with annual mammography next year when due.  Breast exam normal today. 4. Pap smear 2016.  Pap smear of vaginal cuff done today.  No history of abnormal Pap smears previously. 5. Colonoscopy unclear when done.  We discussed this last year about some confusion as to when it  was done.  I asked her last year to check with Dr. Cristina Gong but failed to do so.  She is going to call his office now and follow-up to verify when her colonoscopy was done and when he recommends to repeat it. 6. Health maintenance.  No routine lab work done as patient reports this done elsewhere.  Follow-up 1 year, sooner as needed.   Anastasio Auerbach MD, 10:23 AM 08/17/2018

## 2018-08-17 NOTE — Patient Instructions (Signed)
Call Dr. Osborn Coho office and check about your colonoscopy.  Follow-up for the bone density as scheduled.

## 2018-08-18 LAB — PAP IG W/ RFLX HPV ASCU

## 2018-08-24 DIAGNOSIS — Z1389 Encounter for screening for other disorder: Secondary | ICD-10-CM | POA: Diagnosis not present

## 2018-08-24 DIAGNOSIS — K219 Gastro-esophageal reflux disease without esophagitis: Secondary | ICD-10-CM | POA: Diagnosis not present

## 2018-08-24 DIAGNOSIS — Z0000 Encounter for general adult medical examination without abnormal findings: Secondary | ICD-10-CM | POA: Diagnosis not present

## 2018-08-24 DIAGNOSIS — Z23 Encounter for immunization: Secondary | ICD-10-CM | POA: Diagnosis not present

## 2018-08-24 DIAGNOSIS — J4520 Mild intermittent asthma, uncomplicated: Secondary | ICD-10-CM | POA: Diagnosis not present

## 2018-08-24 DIAGNOSIS — K589 Irritable bowel syndrome without diarrhea: Secondary | ICD-10-CM | POA: Diagnosis not present

## 2018-08-24 DIAGNOSIS — J309 Allergic rhinitis, unspecified: Secondary | ICD-10-CM | POA: Diagnosis not present

## 2018-08-24 DIAGNOSIS — I10 Essential (primary) hypertension: Secondary | ICD-10-CM | POA: Diagnosis not present

## 2018-09-28 DIAGNOSIS — H401131 Primary open-angle glaucoma, bilateral, mild stage: Secondary | ICD-10-CM | POA: Diagnosis not present

## 2018-11-14 ENCOUNTER — Encounter: Payer: Self-pay | Admitting: Gynecology

## 2018-11-14 ENCOUNTER — Ambulatory Visit: Payer: Medicare HMO

## 2018-11-14 DIAGNOSIS — M8580 Other specified disorders of bone density and structure, unspecified site: Secondary | ICD-10-CM

## 2018-11-14 DIAGNOSIS — Z780 Asymptomatic menopausal state: Secondary | ICD-10-CM | POA: Diagnosis not present

## 2018-11-14 DIAGNOSIS — M8589 Other specified disorders of bone density and structure, multiple sites: Secondary | ICD-10-CM | POA: Diagnosis not present

## 2018-11-15 ENCOUNTER — Other Ambulatory Visit: Payer: Self-pay | Admitting: Gynecology

## 2018-11-30 DIAGNOSIS — H401131 Primary open-angle glaucoma, bilateral, mild stage: Secondary | ICD-10-CM | POA: Diagnosis not present

## 2018-12-25 ENCOUNTER — Other Ambulatory Visit: Payer: Self-pay | Admitting: Gynecology

## 2018-12-25 DIAGNOSIS — Z1231 Encounter for screening mammogram for malignant neoplasm of breast: Secondary | ICD-10-CM

## 2019-02-01 ENCOUNTER — Ambulatory Visit
Admission: RE | Admit: 2019-02-01 | Discharge: 2019-02-01 | Disposition: A | Discharge status: Home / Self Care | Payer: Medicare Other | Source: Ambulatory Visit | Attending: Gynecology | Admitting: Gynecology

## 2019-02-01 DIAGNOSIS — Z1231 Encounter for screening mammogram for malignant neoplasm of breast: Secondary | ICD-10-CM

## 2019-08-22 ENCOUNTER — Other Ambulatory Visit: Payer: Self-pay

## 2019-08-23 ENCOUNTER — Encounter: Payer: Self-pay | Admitting: Gynecology

## 2019-08-23 ENCOUNTER — Ambulatory Visit (INDEPENDENT_AMBULATORY_CARE_PROVIDER_SITE_OTHER): Payer: Medicare Other | Admitting: Gynecology

## 2019-08-23 VITALS — BP 140/88 | Ht 63.0 in | Wt 179.0 lb

## 2019-08-23 DIAGNOSIS — Z01419 Encounter for gynecological examination (general) (routine) without abnormal findings: Secondary | ICD-10-CM

## 2019-08-23 DIAGNOSIS — N952 Postmenopausal atrophic vaginitis: Secondary | ICD-10-CM | POA: Diagnosis not present

## 2019-08-23 DIAGNOSIS — M8580 Other specified disorders of bone density and structure, unspecified site: Secondary | ICD-10-CM

## 2019-08-23 DIAGNOSIS — R8290 Unspecified abnormal findings in urine: Secondary | ICD-10-CM | POA: Diagnosis not present

## 2019-08-23 NOTE — Progress Notes (Addendum)
    Rachel Barrett 12-Jul-1946 [id number]        73 y.o.  G2P1011 for breast and pelvic exam.  She does note when she wakes up in the morning her urine smells strong.  No frequency dysuria urgency fever or chills.  No incontinence.  Past medical history,surgical history, problem list, medications, allergies, family history and social history were all reviewed and documented as reviewed in the EPIC chart.  ROS:  Performed with pertinent positives and negatives included in the history, assessment and plan.   Additional significant findings : None   Exam: Copywriter, advertising Vitals:   08/23/19 1000  BP: 140/88  Weight: 179 lb (81.2 kg)  Height: 5\' 3"  (1.6 m)   Body mass index is 31.71 kg/m.  General appearance:  Normal affect, orientation and appearance. Skin: Grossly normal HEENT: Without gross lesions.  No cervical or supraclavicular adenopathy. Thyroid normal.  Lungs:  Clear without wheezing, rales or rhonchi Cardiac: RR, without RMG Abdominal:  Soft, nontender, without masses, guarding, rebound, organomegaly or hernia Breasts:  Examined lying and sitting without masses, retractions, discharge or axillary adenopathy. Pelvic:  Ext, BUS, Vagina: With atrophic changes  Adnexa: Without masses or tenderness    Anus and perineum: Normal   Rectovaginal: Normal sphincter tone without palpated masses or tenderness.    Assessment/Plan:  73 y.o. G81P1011 female for breast and pelvic exam.  Status post TAH for leiomyoma 1989.  1. Postmenopausal/atrophic genital changes.  No significant menopausal symptoms. 2. Complaints of strong urine smell in the morning.  No UTI symptoms.  Check urine analysis today.  Increase fluids with hydration discussed. 3. Osteopenia.  DEXA 2019 T score -1.9 FRAX 7.9% / 1.4%.  Plan repeat DEXA next year at 2-year interval. 4. Pap smear 2019.  No Pap smear done today.  No history of abnormal Pap smears.  Options to stop screening per current screening guidelines  reviewed.  Will readdress on an annual basis. 5. Mammography 01/2019.  Continue with annual mammography when due.  Breast exam normal today. 6. Colonoscopy 2015.  Repeat at their recommended interval. 7. Health maintenance.  Mild elevated blood pressure noted.  Patient asked to have rechecked in non-exam situation.  Follow-up with primary if remains elevated.  No routine lab work done as patient does this elsewhere.  Follow-up 1 year, sooner as needed.   Anastasio Auerbach MD, 10:21 AM 08/23/2019

## 2019-08-23 NOTE — Addendum Note (Signed)
Addended by: Thurnell Garbe A on: 08/23/2019 10:42 AM   Modules accepted: Orders

## 2019-08-23 NOTE — Patient Instructions (Signed)
Follow-up in 1 year for annual exam, sooner as needed. 

## 2019-08-26 LAB — URINALYSIS, COMPLETE W/RFL CULTURE
Bilirubin Urine: NEGATIVE
Glucose, UA: NEGATIVE
Hyaline Cast: NONE SEEN /LPF
Nitrites, Initial: NEGATIVE
Specific Gravity, Urine: 1.025 (ref 1.001–1.03)
pH: 5.5 (ref 5.0–8.0)

## 2019-08-26 LAB — URINE CULTURE
MICRO NUMBER:: 871016
SPECIMEN QUALITY:: ADEQUATE

## 2019-08-26 LAB — CULTURE INDICATED

## 2019-08-27 ENCOUNTER — Other Ambulatory Visit: Payer: Self-pay | Admitting: *Deleted

## 2019-08-27 MED ORDER — NITROFURANTOIN MONOHYD MACRO 100 MG PO CAPS: 100.0000 mg | ORAL_CAPSULE | Freq: Two times a day (BID) | ORAL | 0 refills | Status: DC

## 2019-09-19 ENCOUNTER — Encounter: Payer: Self-pay | Admitting: Gynecology

## 2020-01-04 ENCOUNTER — Other Ambulatory Visit: Payer: Self-pay | Admitting: Internal Medicine

## 2020-01-04 DIAGNOSIS — Z1231 Encounter for screening mammogram for malignant neoplasm of breast: Secondary | ICD-10-CM

## 2020-02-10 ENCOUNTER — Ambulatory Visit: Payer: Medicare Other | Attending: Internal Medicine

## 2020-02-10 DIAGNOSIS — Z23 Encounter for immunization: Secondary | ICD-10-CM

## 2020-02-10 NOTE — Progress Notes (Signed)
   Covid-19 Vaccination Clinic  Name:  Rachel Barrett    MRN: [id number] DOB: 1946/07/05  02/10/2020  [name] was observed post Covid-19 immunization for 15 minutes without incidence. She was provided with Vaccine Information Sheet and instruction to access the V-Safe system.   [name] was instructed to call 911 with any severe reactions post vaccine: Marland Kitchen Difficulty breathing  . Swelling of your face and throat  . A fast heartbeat  . A bad rash all over your body  . Dizziness and weakness    Immunizations Administered    Name Date Dose VIS Date Route   Pfizer COVID-19 Vaccine 02/10/2020 11:01 AM 0.3 mL 11/23/2019 Intramuscular   Manufacturer: [place]   Lot: [id number]   [place]: [id number]

## 2020-02-11 ENCOUNTER — Other Ambulatory Visit: Payer: Self-pay

## 2020-02-11 ENCOUNTER — Ambulatory Visit
Admission: RE | Admit: 2020-02-11 | Discharge: 2020-02-11 | Disposition: A | Discharge status: Home / Self Care | Payer: Medicare Other | Source: Ambulatory Visit | Attending: Internal Medicine | Admitting: Internal Medicine

## 2020-02-11 DIAGNOSIS — Z1231 Encounter for screening mammogram for malignant neoplasm of breast: Secondary | ICD-10-CM

## 2020-02-12 ENCOUNTER — Ambulatory Visit: Payer: Medicare Other

## 2020-03-11 ENCOUNTER — Ambulatory Visit: Payer: Medicare Other | Attending: Internal Medicine

## 2020-03-11 DIAGNOSIS — Z23 Encounter for immunization: Secondary | ICD-10-CM

## 2020-03-11 NOTE — Progress Notes (Signed)
   Covid-19 Vaccination Clinic  Name:  Aziah Cepero    MRN: [id number] DOB: Aug 27, 1946  03/11/2020  Ms. [place] was observed post Covid-19 immunization for 15 minutes without incident. She was provided with Vaccine Information Sheet and instruction to access the V-Safe system.   Ms. Ruhe was instructed to call 911 with any severe reactions post vaccine: Marland Kitchen Difficulty breathing  . Swelling of face and throat  . A fast heartbeat  . A bad rash all over body  . Dizziness and weakness   Immunizations Administered    Name Date Dose VIS Date Route   Pfizer COVID-19 Vaccine 03/11/2020  9:59 AM 0.3 mL 11/23/2019 Intramuscular   Manufacturer: [place]   Lot: [id number]   [place]: [id number]

## 2020-08-28 ENCOUNTER — Encounter: Payer: Self-pay | Admitting: Obstetrics and Gynecology

## 2020-08-28 ENCOUNTER — Other Ambulatory Visit: Payer: Self-pay

## 2020-08-28 ENCOUNTER — Ambulatory Visit (INDEPENDENT_AMBULATORY_CARE_PROVIDER_SITE_OTHER): Payer: Medicare Other | Admitting: Obstetrics and Gynecology

## 2020-08-28 VITALS — BP 124/82 | Ht 64.0 in | Wt 189.0 lb

## 2020-08-28 DIAGNOSIS — M8580 Other specified disorders of bone density and structure, unspecified site: Secondary | ICD-10-CM

## 2020-08-28 DIAGNOSIS — Z01419 Encounter for gynecological examination (general) (routine) without abnormal findings: Secondary | ICD-10-CM | POA: Diagnosis not present

## 2020-08-28 NOTE — Progress Notes (Signed)
   Rachel Barrett 1946/10/27 [id number]  SUBJECTIVE:  74 y.o. G2P1011 female here for a breast and pelvic exam. She has no gynecologic concerns.  Current Outpatient Medications  Medication Sig Dispense Refill  . albuterol (PROAIR HFA) 108 (90 BASE) MCG/ACT inhaler Inhale 2 puffs into the lungs as needed.      Marland Kitchen amLODipine (NORVASC) 5 MG tablet Take 5 mg by mouth daily.      . budesonide-formoterol (SYMBICORT) 160-4.5 MCG/ACT inhaler Inhale 2 puffs into the lungs 2 (two) times daily.    . fluticasone (FLONASE) 50 MCG/ACT nasal spray Place 2 sprays into the nose as needed.      . latanoprost (XALATAN) 0.005 % ophthalmic solution 1 drop at bedtime.    . potassium chloride SA (K-DUR,KLOR-CON) 20 MEQ tablet Take 20 mEq by mouth daily.      . timolol (BETIMOL) 0.5 % ophthalmic solution 1 drop 2 (two) times daily.    . valsartan-hydrochlorothiazide (DIOVAN-HCT) 160-25 MG per tablet Take 1 tablet by mouth daily.       No current facility-administered medications for this visit.   Allergies: Augmentin [amoxicillin-pot clavulanate], Clarithromycin, and Bactrim [sulfamethoxazole-trimethoprim]  No LMP recorded. Patient has had a hysterectomy.  Past medical history,surgical history, problem list, medications, allergies, family history and social history were all reviewed and documented as reviewed in the EPIC chart.  GYN ROS: no abnormal bleeding, pelvic pain or discharge, no breast pain or new or enlarging lumps on self exam.  No dysuria, frequency, burning, pain with urination, cloudy/malodorous urine.   OBJECTIVE:  BP 124/82   Ht 5\' 4"  (1.626 m)   Wt 189 lb (85.7 kg)   BMI 32.44 kg/m  The patient appears well, alert, oriented, in no distress.  BREAST EXAM: breasts appear normal, no suspicious masses, no skin or nipple changes or axillary nodes  PELVIC EXAM: VULVA: normal appearing vulva with atrophic change, no masses, tenderness or lesions, VAGINA: normal appearing vagina with trophic  change, normal color and discharge, no lesions, CERVIX: surgically absent, UTERUS: surgically absent, vaginal cuff normal, ADNEXA: no masses, nontender  Chaperone: Aurora Mask (DNP student) present during the examination and performed the pelvic exam with me in attendance to confirm the exam findings   ASSESSMENT:  74 y.o. G2P1011 here for a breast and pelvic exam  PLAN:   1. Postmenopausal. Prior TAH for leiomyoma in 1989. No vaginal bleeding. No significant hot flashes or night sweats. 2. Pap smear 2019.  No history of abnormal Pap smears.  Will address she desires to continue screening at the 3-year interval next year. 3. Mammogram 02/2020.  Normal breast exam today.  She will continue with annual mammograms. 4. Colonoscopy 2015.  She will follow up at the interval recommended by her GI specialist.   5. Osteopenia.  DEXA 11/2018.  T score -1.9, FRAX 7.9% / 1.4%.  Plan to repeat DEXA this year, test is ordered.   6. Health maintenance. She has some edema over her right medial malleolus and says she slipped and had an fall this last summer but did not get checked out. I encouraged her to go to her primary doctor to get this checked out. No labs today as she normally has these completed with her primary care provider.    Return annually or sooner, prn.  Joseph Pierini MD 08/28/20

## 2020-10-28 ENCOUNTER — Encounter: Payer: Self-pay | Admitting: Podiatry

## 2020-10-28 ENCOUNTER — Ambulatory Visit (INDEPENDENT_AMBULATORY_CARE_PROVIDER_SITE_OTHER): Payer: Medicare Other

## 2020-10-28 ENCOUNTER — Other Ambulatory Visit: Payer: Self-pay | Admitting: Podiatry

## 2020-10-28 ENCOUNTER — Ambulatory Visit: Payer: Medicare Other | Admitting: Podiatry

## 2020-10-28 ENCOUNTER — Other Ambulatory Visit: Payer: Self-pay

## 2020-10-28 DIAGNOSIS — S9031XA Contusion of right foot, initial encounter: Secondary | ICD-10-CM

## 2020-10-28 DIAGNOSIS — E010 Iodine-deficiency related diffuse (endemic) goiter: Secondary | ICD-10-CM | POA: Insufficient documentation

## 2020-10-28 DIAGNOSIS — L603 Nail dystrophy: Secondary | ICD-10-CM

## 2020-10-28 DIAGNOSIS — F411 Generalized anxiety disorder: Secondary | ICD-10-CM | POA: Insufficient documentation

## 2020-10-28 DIAGNOSIS — M76821 Posterior tibial tendinitis, right leg: Secondary | ICD-10-CM

## 2020-10-28 MED ORDER — LIDOCAINE-PRILOCAINE 2.5-2.5 % EX CREA: 1.0000 "application " | TOPICAL_CREAM | CUTANEOUS | 0 refills | Status: DC | PRN

## 2020-10-28 NOTE — Progress Notes (Signed)
  Subjective:  Patient ID: Rachel Barrett, female    DOB: 11/11/1946,  MRN: [id number]  Chief Complaint  Patient presents with  . Toe Pain    Hallux right - toenail removed years ago, swelling and soreness x 1 year intermittent  . Toe Pain    Hallux left - toenail removed years ago, piece of nail grows back, trims and files down, lost nail few days ago, tender, some redness  . Foot Injury    Medial foot right - fell in June 2021, swelling, tender, dark since, been rubbing with alcohol  . New Patient (Initial Visit)    74 y.o. female presents with the above complaint. History confirmed with patient.   Objective:  Physical Exam: warm, good capillary refill, no trophic changes or ulcerative lesions, normal DP and PT pulses and normal sensory exam. Left Foot: Hallux nail with mild regrowth of nail from previous avulsion matricectomy Right Foot: Well-healed hallux nail avulsion and matricectomy site with minimal tenderness.  She has pain on palpation to the posterior tibial tendon along its course and insertion  Radiographs: X-ray of the right foot: no fracture, dislocation, swelling or degenerative changes noted Assessment:   1. Contusion of right foot, initial encounter   2. Nail dystrophy   3. Posterior tibial tendinitis of right lower extremity      Plan:  Patient was evaluated and treated and all questions answered.  Discussed with her that there is not much that we build to do further with the previous avulsion sites as there is little to no nail left.  We could flip some skin over to cover the avulsion site but I do not think this would be advisable for her and probably not worth the risk.  Lidocaine prilocaine cream was prescribed to alleviate pain in this area topically  Appears to have posterior tibial tendinitis.  I discussed with her that she should perform the stretching exercises twice daily.  I gave her a Tri-Lock ankle brace for support.  Advised use Voltaren gel  for pain relief   Return if symptoms worsen or fail to improve.

## 2020-10-28 NOTE — Patient Instructions (Signed)
Look for Voltaren gel at the pharmacy over the counter or online (also known as diclofenac 1% gel). Apply to the painful areas 3-4x daily with the supplied dosing card. Allow to dry for 10 minutes before going into socks/shoes    Posterior Tibial Tendinitis  Posterior tibial tendinitis is irritation of a tendon called the posterior tibial tendon. Your posterior tibial tendon is a cord-like tissue that connects bones of your lower leg and foot to a muscle that: 1. Supports your arch. 2. Helps you raise up on your toes. 3. Helps you turn your foot down and in. This condition causes foot and ankle pain. It can also lead to a flat foot. What are the causes? This condition is most often caused by repeated stress to the tendon (overuse injury). It can also be caused by a sudden injury that stresses the tendon, such as landing on your foot after jumping or falling. What increases the risk? This condition is more likely to develop in: 1. People who play a sport that involves putting a lot of pressure on the feet, such as: 1. Basketball. 2. Tennis. 3. Soccer. 4. Hockey. 2. Runners. 3. Females who are older than 73 years of age and are overweight. 4. People with diabetes. 5. People with decreased foot stability. 6. People with flat feet. What are the signs or symptoms? Symptoms include: 1. Pain in the inner ankle. 2. Pain at the arch of your foot. 3. Pain that gets worse with running, walking, or standing. 4. Swelling on the inside of your ankle and foot. 5. Weakness in your ankle or foot. 6. Inability to stand up on tiptoe. 7. Flattening of the arch of your foot. How is this diagnosed? This condition may be diagnosed based on: 1. Your symptoms. 2. Your medical history. 3. A physical exam. 4. Tests, such as: 1. X-ray. 2. MRI. 3. Ultrasound. How is this treated? This condition may be treated by: 1. Putting ice to the injured area. 2. Taking NSAIDs, such as ibuprofen, to reduce pain  and swelling. 3. Wearing a special shoe or shoe insert to support your arch (orthotic). 4. Having physical therapy. 5. Replacing high-impact exercise with low-impact exercise, such as swimming or cycling. If your symptoms do not improve with these treatments, you may need to wear a splint, removable walking boot, or short leg cast for 6-8 weeks to keep your foot and ankle still (immobilized). Follow these instructions at home: If you have a cast, splint, or boot:  Keep it clean and dry.  Check the skin around it every day. Tell your health care provider about any concerns. If you have a cast:  Do not stick anything inside it to scratch your skin. Doing that increases your risk of infection.  You may put lotion on dry skin around the edges of the cast. Do not put lotion on the skin underneath the cast. If you have a splint or boot:  Wear it as told by your health care provider. Remove it only as told by your health care provider.  Loosen it if your toes tingle, become numb, or turn cold and blue. Bathing 1. Do not take baths, swim, or use a hot tub until your health care provider approves. Ask your health care provider if you may take showers. 2. If your cast, splint, or boot is not waterproof: ? Do not let it get wet. ? Cover it with a waterproof covering while you take a bath or a shower. Managing pain and   swelling   1. If directed, put ice on the injured area. ? If you have a removable splint or boot, remove it as told by your health care provider. ? Put ice in a plastic bag. ? Place a towel between your skin and the bag or between your cast and the bag. ? Leave the ice on for 20 minutes, 2-3 times a day. 2. Move your toes often to reduce stiffness and swelling. 3. Raise (elevate) the injured area above the level of your heart while you are sitting or lying down. Activity  Do not use the injured foot to support your body weight until your health care provider says that you  can. Use crutches as told by your health care provider.  Do not do activities that make pain or swelling worse.  Ask your health care provider when it is safe to drive if you have a cast, splint, or boot on your foot.  Return to your normal activities as told by your health care provider. Ask your health care provider what activities are safe for you.  Do exercises as told by your health care provider. General instructions  Take over-the-counter and prescription medicines only as told by your health care provider.  If you have an orthotic, use it as told by your health care provider.  Keep all follow-up visits as told by your health care provider. This is important. How is this prevented?  Wear footwear that is appropriate to your athletic activity.  Avoid athletic activities that cause pain or swelling in your ankle or foot.  Before being active, do range-of-motion and stretching exercises.  If you develop pain or swelling while training, stop training.  If you have pain or swelling that does not improve after a few days of rest, see your health care provider.  If you start a new athletic activity, start gradually so you can build up your strength and flexibility. Contact a health care provider if:  Your symptoms get worse.  Your symptoms do not improve in 6-8 weeks.  You develop new, unexplained symptoms.  Your splint, boot, or cast gets damaged. Summary  Posterior tibial tendinitis is irritation of a tendon called the posterior tibial tendon.  This condition is most often caused by repeated stress to the tendon (overuse injury).  This condition causes foot pain and ankle pain. It can also lead to a flat foot.  This condition may be treated by not doing high-impact activities, applying ice, having physical therapy, wearing orthotics, and wearing a cast, splint, or boot if needed. This information is not intended to replace advice given to you by your health care  provider. Make sure you discuss any questions you have with your health care provider. Document Revised: 03/27/2019 Document Reviewed: 02/01/2019 Elsevier Patient Education  2020 [employer]  Posterior Tibial Tendinitis Rehab Ask your health care provider which exercises are safe for you. Do exercises exactly as told by your health care provider and adjust them as directed. It is normal to feel mild stretching, pulling, tightness, or discomfort as you do these exercises. Stop right away if you feel sudden pain or your pain gets worse. Do not begin these exercises until told by your health care provider. Stretching and range-of-motion exercises These exercises warm up your muscles and joints and improve the movement and flexibility in your ankle and foot. These exercises may also help to relieve pain. Standing wall calf stretch, knee straight   4. Stand with your hands against a wall.   5. Extend your left / right leg behind you, and bend your front knee slightly. If directed, place a folded washcloth under the arch of your foot for support. 6. Point the toes of your back foot slightly inward. 7. Keeping your heels on the floor and your back knee straight, shift your weight toward the wall. Do not allow your back to arch. You should feel a gentle stretch in your upper left / right calf. 8. Hold this position for 10 seconds. Repeat 10 times. Complete this exercise 2 times a day. Standing wall calf stretch, knee bent 7. Stand with your hands against a wall. 8. Extend your left / right leg behind you, and bend your front knee slightly. If directed, place a folded washcloth under the arch of your foot for support. 9. Point the toes of your back foot slightly inward. 10. Unlock your back knee so it is bent. Keep your heels on the floor. You should feel a gentle stretch deep in your lower left / right calf. 11. Hold this position for 10 seconds. Repeat 10 times. Complete this exercise 2 times a  day. Strengthening exercises These exercises build strength and endurance in your ankle and foot. Endurance is the ability to use your muscles for a long time, even after they get tired. Ankle inversion with band 8. Secure one end of a rubber exercise band or tubing to a fixed object, such as a table leg or a pole, that will stay still when the band is pulled. 9. Loop the other end of the band around the middle of your left / right foot. 10. Sit on the floor facing the object with your left / right leg extended. The band or tube should be slightly tense when your foot is relaxed. 11. Leading with your big toe, slowly bring your left / right foot and ankle inward, toward your other foot (inversion). 12. Hold this position for 10 seconds. 13. Slowly return your foot to the starting position. Repeat 10 times. Complete this exercise 2 times a day. Towel curls   5. Sit in a chair on a non-carpeted surface, and put your feet on the floor. 6. Place a towel in front of your feet. 7. Keeping your heel on the floor, put your left / right foot on the towel. 8. Pull the towel toward you by grabbing the towel with your toes and curling them under. Keep your heel on the floor while you do this. 9. Let your toes relax. 10. Grab the towel with your toes again. Keep going until the towel is completely underneath your foot. Repeat 10 times. Complete this exercise 2 times a day. Balance exercise This exercise improves or maintains your balance. Balance is important in preventing falls. Single leg stand 6. Without wearing shoes, stand near a railing or in a doorway. You may hold on to the railing or door frame as needed for balance. 7. Stand on your left / right foot. Keep your big toe down on the floor and try to keep your arch lifted. ? If balancing in this position is too easy, try the exercise with your eyes closed or while standing on a pillow. 8. Hold this position for 10 seconds. Repeat 10 times.  Complete this exercise 2 times a day. This information is not intended to replace advice given to you by your health care provider. Make sure you discuss any questions you have with your health care provider.  

## 2020-12-23 ENCOUNTER — Other Ambulatory Visit: Payer: Self-pay | Admitting: Obstetrics and Gynecology

## 2020-12-23 ENCOUNTER — Other Ambulatory Visit: Payer: Self-pay

## 2020-12-23 ENCOUNTER — Ambulatory Visit (INDEPENDENT_AMBULATORY_CARE_PROVIDER_SITE_OTHER): Payer: Medicare Other

## 2020-12-23 DIAGNOSIS — M8589 Other specified disorders of bone density and structure, multiple sites: Secondary | ICD-10-CM | POA: Diagnosis not present

## 2020-12-23 DIAGNOSIS — Z780 Asymptomatic menopausal state: Secondary | ICD-10-CM

## 2020-12-23 DIAGNOSIS — Z01419 Encounter for gynecological examination (general) (routine) without abnormal findings: Secondary | ICD-10-CM

## 2020-12-23 DIAGNOSIS — M8580 Other specified disorders of bone density and structure, unspecified site: Secondary | ICD-10-CM

## 2021-01-29 DIAGNOSIS — R42 Dizziness and giddiness: Secondary | ICD-10-CM | POA: Diagnosis not present

## 2021-02-05 DIAGNOSIS — J343 Hypertrophy of nasal turbinates: Secondary | ICD-10-CM | POA: Diagnosis not present

## 2021-02-05 DIAGNOSIS — J342 Deviated nasal septum: Secondary | ICD-10-CM | POA: Diagnosis not present

## 2021-02-05 DIAGNOSIS — J310 Chronic rhinitis: Secondary | ICD-10-CM | POA: Diagnosis not present

## 2021-02-05 DIAGNOSIS — R42 Dizziness and giddiness: Secondary | ICD-10-CM | POA: Diagnosis not present

## 2021-02-05 DIAGNOSIS — H832X1 Labyrinthine dysfunction, right ear: Secondary | ICD-10-CM | POA: Diagnosis not present

## 2021-02-06 ENCOUNTER — Other Ambulatory Visit: Payer: Self-pay | Admitting: Otolaryngology

## 2021-02-06 DIAGNOSIS — H903 Sensorineural hearing loss, bilateral: Secondary | ICD-10-CM

## 2021-02-18 ENCOUNTER — Other Ambulatory Visit: Payer: Self-pay | Admitting: Internal Medicine

## 2021-02-18 DIAGNOSIS — Z1231 Encounter for screening mammogram for malignant neoplasm of breast: Secondary | ICD-10-CM

## 2021-02-23 DIAGNOSIS — J4530 Mild persistent asthma, uncomplicated: Secondary | ICD-10-CM | POA: Diagnosis not present

## 2021-02-23 DIAGNOSIS — J4520 Mild intermittent asthma, uncomplicated: Secondary | ICD-10-CM | POA: Diagnosis not present

## 2021-02-23 DIAGNOSIS — K219 Gastro-esophageal reflux disease without esophagitis: Secondary | ICD-10-CM | POA: Diagnosis not present

## 2021-02-23 DIAGNOSIS — I10 Essential (primary) hypertension: Secondary | ICD-10-CM | POA: Diagnosis not present

## 2021-02-26 ENCOUNTER — Other Ambulatory Visit: Payer: Self-pay

## 2021-02-26 ENCOUNTER — Ambulatory Visit
Admission: RE | Admit: 2021-02-26 | Discharge: 2021-02-26 | Disposition: A | Discharge status: Home / Self Care | Payer: Medicare Other | Source: Ambulatory Visit | Attending: Internal Medicine | Admitting: Internal Medicine

## 2021-02-26 DIAGNOSIS — Z1231 Encounter for screening mammogram for malignant neoplasm of breast: Secondary | ICD-10-CM

## 2021-03-03 ENCOUNTER — Ambulatory Visit
Admission: RE | Admit: 2021-03-03 | Discharge: 2021-03-03 | Disposition: A | Discharge status: Home / Self Care | Payer: Medicare Other | Source: Ambulatory Visit | Attending: Otolaryngology | Admitting: Otolaryngology

## 2021-03-03 DIAGNOSIS — R519 Headache, unspecified: Secondary | ICD-10-CM | POA: Diagnosis not present

## 2021-03-03 DIAGNOSIS — R42 Dizziness and giddiness: Secondary | ICD-10-CM | POA: Diagnosis not present

## 2021-03-03 DIAGNOSIS — H903 Sensorineural hearing loss, bilateral: Secondary | ICD-10-CM

## 2021-03-03 MED ORDER — GADOBENATE DIMEGLUMINE 529 MG/ML IV SOLN
16.0000 mL | Freq: Once | INTRAVENOUS | Status: AC | PRN
  Administered 2021-03-03: 16 mL via INTRAVENOUS

## 2021-03-05 ENCOUNTER — Other Ambulatory Visit: Payer: Self-pay

## 2021-03-05 ENCOUNTER — Ambulatory Visit: Payer: Medicare Other | Attending: Otolaryngology | Admitting: Physical Therapy

## 2021-03-05 ENCOUNTER — Encounter: Payer: Self-pay | Admitting: Physical Therapy

## 2021-03-05 DIAGNOSIS — M542 Cervicalgia: Secondary | ICD-10-CM | POA: Insufficient documentation

## 2021-03-05 DIAGNOSIS — R42 Dizziness and giddiness: Secondary | ICD-10-CM | POA: Diagnosis not present

## 2021-03-06 NOTE — Therapy (Signed)
[facility] [address] [place], [place], [address] Phone: [phone]   Fax:  [phone]  Physical Therapy Evaluation  Patient Details  Name: Rachel Barrett MRN: [id number] Date of Birth: [birth year] Referring Provider (PT): Dr. Leta Baptist   Encounter Date: 03/05/2021   PT End of Session - 03/06/21 0751    Visit Number 1    Number of Visits 1   per pt's request - eval only   Authorization Type [facility] Medicare    Authorization Time Period 03-05-21 - 05-05-21    PT Start Time 0845    PT Stop Time 0933    PT Time Calculation (min) 48 min    Activity Tolerance Patient tolerated treatment well    Behavior During Therapy [facility] for tasks assessed/performed           Past Medical History:  Diagnosis Date  . ALLERGIC RHINITIS 02/17/2010  . Allergy   . ASTHMA 02/17/2010  . GERD 02/17/2010  . Glaucoma   . GOITER, MULTINODULAR 02/17/2010  . HYPERGLYCEMIA 02/22/2011  . HYPERTENSION 02/17/2010  . IBS 02/17/2010  . Osteopenia 11/2018   T score -1.9 FRAX 7.9% / 1.4%  . Retina hole   . Unspecified disorder of liver 02/22/2011    Past Surgical History:  Procedure Laterality Date  . ABDOMINAL HYSTERECTOMY  1989   Leiomyoma  . Bladder Bx  2009  . BREAST EXCISIONAL BIOPSY Left 1999  . BREAST SURGERY     Breast cyst  . CHOLECYSTECTOMY    . EYE SURGERY    . EYE SURGERY    . Hole in [place]    . NOSE SURGERY      There were no vitals filed for this visit.    Subjective Assessment - 03/05/21 0848    Subjective Pt reports she has an "imbalance between Rt ear and Lt ear"; pt states she has had 4 surgeries for deviated septum with last surgery done in 2014 (done by Dr. Ernesto Rutherford); pt reports she has dizziness "but I just put up with it"; pt states she sleeps on her left side all the time because if she sleeps on her right side "I will be drunk" - would be dizzy and very unsteady. Pt reports occasional nausea but no vomiting.  Pt denies  hearing loss and tinnitus. States she hasn't noticed a pattern as to time of day it is worse - feels like Rt side of head is numb.  States heating pad helps.  Had MRI done this week.  Pt states she feels the symptoms brings on the dizziness just "on the right side".  Pt states she has been under alot of stress lately due to break ins at her home.    Patient Stated Goals "try to figure out if there is a way to get both sides of my head to feel the same"    Currently in Pain? Other (Comment)   Feels like " Rt side is blocked alot"             [facility] PT Assessment - 03/06/21 0001      Assessment   Medical Diagnosis Vertigo    Referring Provider (PT) Dr. Leta Baptist    Onset Date/Surgical Date --   approx. 2 years ago; Referral date 02-18-21     Precautions   Precautions Other (comment)   intermittent dizziness     Restrictions   Weight Bearing Restrictions No      Balance Screen  Has the patient fallen in the past 6 months No    Has the patient had a decrease in activity level because of a fear of falling?  No    Is the patient reluctant to leave their home because of a fear of falling?  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                  Vestibular Assessment - 03/06/21 0001      Symptom Behavior   Type of Dizziness  Spinning;"Funny feeling in head"    Frequency of Dizziness occurs daily    Symptom Nature Variable;Motion provoked    Aggravating Factors Rolling to right;Turning head quickly    Relieving Factors Rest    Progression of Symptoms No change since onset      Oculomotor Exam   Oculomotor Alignment Normal    Spontaneous Absent    Smooth Pursuits Intact    Saccades Intact      Positional Testing   Dix-Hallpike Dix-Hallpike Right;Dix-Hallpike Left    Sidelying Test Sidelying Right;Sidelying Left      Dix-Hallpike Right   Dix-Hallpike Right  Duration none    Dix-Hallpike Right Symptoms No nystagmus      Dix-Hallpike Left   Dix-Hallpike Left Duration none    Dix-Hallpike Left Symptoms No nystagmus      Sidelying Right   Sidelying Right Duration none    Sidelying Right Symptoms No nystagmus      Sidelying Left   Sidelying Left Duration none    Sidelying Left Symptoms No nystagmus              Objective measurements completed on examination: See above findings.               PT Education - 03/06/21 0746    Education Details pt instructed in cervical ROM exercises and stretches due to c/o neck and Rt upper trap tightness -Medbridge [email or web address]    Person(s) Educated Patient    Methods Explanation;Demonstration;Handout    Comprehension Verbalized understanding;Returned demonstration               PT Long Term Goals - 03/06/21 0806      PT LONG TERM GOAL #1   Title N/A - eval only per pt's request           Access Code: [email or web address] URL: [email or web address] Date: 03/06/2021 Prepared by: Ethelene Browns  Exercises Seated Assisted Cervical Rotation with Towel - 1 x daily - 7 x weekly - 1 sets - 3 reps - 20-30 secs hold Seated Cervical Flexion AROM - 1 x daily - 7 x weekly - 1 sets - 10 reps - 3-5 - hold Seated Cervical Sidebending Stretch - 1 x daily - 7 x weekly - 1 sets - 10 reps - 5 secs hold         Plan - 03/06/21 0752    Clinical Impression Statement Pt is a 75 yr old lady with c/o Rt sided ear and head pain with c/o intermittent dizziness.  Pt's description of dizziness and symptoms are vague and difficult to understand and she reports "dizziness is only on right side".  Per Dr. Deeann Saint notes pt has chronic dizziness likely secondary to central vestibular dysfunction.  She has chronic rhinitis, severe nasal mucosal congestion and nasal septal deviation (has had 4 nasal surgeries  for deviated septum by Dr. Ernesto Rutherford with last sx in 2014).  Pt c/o cervical musc. tightness which she  states seems to be tighter on Rt side and also reports lack of exercise since start of pandemic.  Pt was instructed in cervical stretches for HEP and was recommended to join [facility] for participation in [employer] program, as she declines ongoing PT at this time - due to her being caregiver for her husband who is currently on dialysis and also due to financial concerns with co-pay.  Eval only per pt's request.    Personal Factors and Comorbidities Comorbidity 2;Past/Current Experience;Fitness;Time since onset of injury/illness/exacerbation    Comorbidities chronic rhinitis, dizziness of central vestibular dysfunction, HTN, asthma, osteopenia, glaucoma    Examination-Activity Limitations Locomotion Level;Transfers;Carry;Bed Mobility;Stairs;Stand;Squat    Examination-Participation Restrictions Interpersonal Relationship;Laundry;Shop;Driving;Meal Prep;Cleaning;Community Activity    Stability/Clinical Decision Making Evolving/Moderate complexity    Clinical Decision Making Moderate    Rehab Potential Fair    PT Frequency One time visit   per pt's request - eval only   PT Treatment/Interventions ADLs/Self Care Home Management;Patient/family education;Therapeutic exercise    PT Next Visit Plan eval only per pt's request    PT Home Exercise Plan Nowthen and Agree with Plan of Care Patient           Patient will benefit from skilled therapeutic intervention in order to improve the following deficits and impairments:  Dizziness,Decreased balance,Impaired flexibility  Visit Diagnosis: Dizziness and giddiness - Plan: PT plan of care cert/re-cert  Cervicalgia - Plan: PT plan of care cert/re-cert     Problem List Patient Active Problem List   Diagnosis Date Noted  . Anxiety state 10/28/2020  . Big thyroid 10/28/2020  . Intermediate uveitis, bilateral 06/11/2014  . Asthma 09/28/2012  . Anterior uveitis 07/18/2012  . 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 Primary open  angle glaucoma 07/18/2012  . Retina hole 07/18/2012  . Fibroid   . Osteopenia   . UNSPECIFIED DISORDER OF LIVER 02/22/2011  . HYPERGLYCEMIA 02/22/2011  . GOITER, MULTINODULAR 02/17/2010  . HYPERTENSION 02/17/2010  . ALLERGIC RHINITIS 02/17/2010  . ASTHMA 02/17/2010  . GERD 02/17/2010  . IBS 02/17/2010    [place], PT 03/06/2021, [facility] [address] [place], [place], [address] Phone: [phone]   Fax:  [phone]  Name: Rachel Barrett MRN: [id number] Date of Birth: March 18, 1946

## 2021-04-28 DIAGNOSIS — J4520 Mild intermittent asthma, uncomplicated: Secondary | ICD-10-CM | POA: Diagnosis not present

## 2021-04-28 DIAGNOSIS — R1033 Periumbilical pain: Secondary | ICD-10-CM | POA: Diagnosis not present

## 2021-04-28 DIAGNOSIS — I10 Essential (primary) hypertension: Secondary | ICD-10-CM | POA: Diagnosis not present

## 2021-06-03 DIAGNOSIS — K219 Gastro-esophageal reflux disease without esophagitis: Secondary | ICD-10-CM | POA: Diagnosis not present

## 2021-06-03 DIAGNOSIS — J4530 Mild persistent asthma, uncomplicated: Secondary | ICD-10-CM | POA: Diagnosis not present

## 2021-06-03 DIAGNOSIS — J4520 Mild intermittent asthma, uncomplicated: Secondary | ICD-10-CM | POA: Diagnosis not present

## 2021-06-03 DIAGNOSIS — I10 Essential (primary) hypertension: Secondary | ICD-10-CM | POA: Diagnosis not present

## 2021-07-09 DIAGNOSIS — J4520 Mild intermittent asthma, uncomplicated: Secondary | ICD-10-CM | POA: Diagnosis not present

## 2021-07-09 DIAGNOSIS — K219 Gastro-esophageal reflux disease without esophagitis: Secondary | ICD-10-CM | POA: Diagnosis not present

## 2021-07-09 DIAGNOSIS — I10 Essential (primary) hypertension: Secondary | ICD-10-CM | POA: Diagnosis not present

## 2021-07-09 DIAGNOSIS — J4530 Mild persistent asthma, uncomplicated: Secondary | ICD-10-CM | POA: Diagnosis not present

## 2021-08-27 DIAGNOSIS — I10 Essential (primary) hypertension: Secondary | ICD-10-CM | POA: Diagnosis not present

## 2021-08-27 DIAGNOSIS — K219 Gastro-esophageal reflux disease without esophagitis: Secondary | ICD-10-CM | POA: Diagnosis not present

## 2021-08-27 DIAGNOSIS — J4520 Mild intermittent asthma, uncomplicated: Secondary | ICD-10-CM | POA: Diagnosis not present

## 2021-08-27 DIAGNOSIS — J4530 Mild persistent asthma, uncomplicated: Secondary | ICD-10-CM | POA: Diagnosis not present

## 2021-09-16 NOTE — Progress Notes (Signed)
GYNECOLOGY  VISIT   HPI: 75 y.o.   Married  [place] American  female   [phone] with No LMP recorded. Patient has had a hysterectomy.   here for breast and pelvic exam.   She is also followed for osteopenia.   She would like to loose weight.  Eating late at night.  Not exercising often.  Has a stationary bike.   Son is 49 yo.  GYNECOLOGIC HISTORY: No LMP recorded. Patient has had a hysterectomy. Contraception: Hyst Menopausal hormone therapy: none  Last mammogram: 02-26-21 3D/Neg/BiRads1 Last pap smear: 08-17-18 Neg, 07-18-15 Neg, 06-20-12 Neg No history of abnormal pap.   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   Primary open angle glaucoma 07/18/2012   Retina hole 07/18/2012   Fibroid    Osteopenia    UNSPECIFIED DISORDER OF LIVER 02/22/2011   HYPERGLYCEMIA 02/22/2011   GOITER, MULTINODULAR 02/17/2010   HYPERTENSION 02/17/2010   ALLERGIC RHINITIS 02/17/2010   ASTHMA 02/17/2010   GERD 02/17/2010   IBS 02/17/2010    Past Medical History:  Diagnosis Date   ALLERGIC RHINITIS 02/17/2010   Allergy    ASTHMA 02/17/2010   GERD 02/17/2010   Glaucoma    GOITER, MULTINODULAR 02/17/2010   HYPERGLYCEMIA 02/22/2011   HYPERTENSION 02/17/2010   IBS 02/17/2010   Osteopenia 11/2018   T score -1.9 FRAX 7.9% / 1.4%   Retina hole    Unspecified disorder of liver 02/22/2011    Past Surgical History:  Procedure Laterality Date   ABDOMINAL HYSTERECTOMY  1989   Leiomyoma   Bladder Bx  2009   BREAST EXCISIONAL BIOPSY Left 1999   BREAST SURGERY     Breast cyst    CHOLECYSTECTOMY     EYE SURGERY     EYE SURGERY     Hole in Retina     NOSE SURGERY      Current Outpatient Medications  Medication Sig Dispense Refill   albuterol (VENTOLIN HFA) 108 (90 Base) MCG/ACT inhaler Inhale 2 puffs into the lungs as needed.       amLODipine (NORVASC) 5 MG tablet Take 5 mg by mouth daily.       budesonide-formoterol (SYMBICORT) 160-4.5 MCG/ACT inhaler Inhale 2 puffs into the lungs 2 (two) times daily.     fluticasone (FLONASE) 50 MCG/ACT nasal spray Place 2 sprays into the nose as needed.       latanoprost (XALATAN) 0.005 % ophthalmic solution 1 drop at bedtime.     potassium chloride SA (K-DUR,KLOR-CON) 20 MEQ tablet Take 20 mEq by mouth daily.       timolol (TIMOPTIC) 0.5 % ophthalmic solution      valsartan-hydrochlorothiazide (DIOVAN-HCT) 160-25 MG per tablet Take 1 tablet by mouth daily.  No current facility-administered medications for this visit.     ALLERGIES: Other, Augmentin [amoxicillin-pot clavulanate], Clarithromycin, and Bactrim [sulfamethoxazole-trimethoprim]  Family History  Problem Relation Age of Onset   Goiter Mother        benign   Hypertension Brother    Lung cancer Brother    Cancer Brother        Peritoneal cancer   Hypertension Father    Heart disease Father    Hypertension Sister    Breast cancer Sister 80   Colon cancer Paternal Aunt    Heart disease Maternal Grandmother    Heart disease Maternal Grandfather    Heart disease Paternal Grandmother    Heart disease Paternal Grandfather    Diabetes Neg Hx     Social History   Socioeconomic History   Marital status: Married    Spouse name: Not on file   Number of children: Not on file   Years of education: Not on file   Highest education level: Not on file  Occupational History   Occupation: Administration    Employer: [employer]  Tobacco Use   Smoking status: Never   Smokeless tobacco: Never  Vaping Use   Vaping Use: Never used  Substance and Sexual  Activity   Alcohol use: No    Alcohol/week: 0.0 standard drinks   Drug use: No   Sexual activity: Not Currently    Birth control/protection: Surgical    Comment: 1st intercourse 75 yo-Fewer than 5 partners  Other Topics Concern   Not on file  Social History Narrative   Not on file   Social Determinants of Health   Financial Resource Strain: Not on file  Food Insecurity: Not on file  Transportation Needs: Not on file  Physical Activity: Not on file  Stress: Not on file  Social Connections: Not on file  Intimate Partner Violence: Not on file    Review of Systems  All other systems reviewed and are negative.  PHYSICAL EXAMINATION:    BP 134/84   Pulse 74   Ht 5' 3.5" (1.613 m)   Wt 186 lb (84.4 kg)   SpO2 96%   BMI 32.43 kg/m     General appearance: alert, cooperative and appears stated age Head: Normocephalic, without obvious abnormality, atraumatic Neck: no adenopathy, supple, symmetrical, trachea midline and thyroid normal to inspection and palpation Lungs: clear to auscultation bilaterally Breasts: normal appearance, no masses or tenderness, left nipple inversion and normal right nipple, No nipple discharge or bleeding, No axillary or supraclavicular adenopathy Heart: regular rate and rhythm Abdomen: soft, non-tender, no masses,  no organomegaly Extremities: extremities normal, atraumatic, no cyanosis or edema Skin: Skin color, texture, turgor normal. No rashes or lesions Lymph nodes: Cervical, supraclavicular, and axillary nodes normal. No abnormal inguinal nodes palpated Neurologic: Grossly normal  Pelvic: External genitalia:  no lesions              Urethra:  normal appearing urethra with no masses, tenderness or lesions              Bartholins and Skenes: normal                 Vagina: normal appearing vagina with normal color and discharge, no lesions              Cervix:absent                Bimanual Exam:  Uterus:  absent  Adnexa: no mass,  fullness, tenderness              Rectal exam: yes.  Confirms.              Anus:  normal sphincter tone, no lesions  Chaperone was present for exam:  Estill Bamberg.   ASSESSMENT  Well woman visit with GYN exam. Status post TAH for fibroids, 1989.  Ovaries remain.  Osteopenia.  FH breast and peritoneal cancer.  Personal left nipple inversion, old change per patient.   PLAN  No pap today per ASCCP guidelines.  Yearly mammogram.  Self breast exam encouraged.  Bone density study from 2022 reviewed.  Calcium 1200 mg in divided doses and vit D 600 - 800 IU daily.  Exercise recommended.  Fu in 2 years for breast/pelvic and bone density study.  An After Visit Summary was printed and given to the patient.  29 min  total time was spent for this patient encounter, including preparation, face-to-face counseling with the patient, coordination of care, and documentation of the encounter.

## 2021-09-17 ENCOUNTER — Other Ambulatory Visit: Payer: Self-pay

## 2021-09-17 ENCOUNTER — Encounter: Payer: Self-pay | Admitting: Obstetrics and Gynecology

## 2021-09-17 ENCOUNTER — Ambulatory Visit (INDEPENDENT_AMBULATORY_CARE_PROVIDER_SITE_OTHER): Payer: Medicare Other | Admitting: Obstetrics and Gynecology

## 2021-09-17 VITALS — BP 134/84 | HR 74 | Ht 63.5 in | Wt 186.0 lb

## 2021-09-17 DIAGNOSIS — Z01419 Encounter for gynecological examination (general) (routine) without abnormal findings: Secondary | ICD-10-CM | POA: Diagnosis not present

## 2021-09-17 DIAGNOSIS — M8580 Other specified disorders of bone density and structure, unspecified site: Secondary | ICD-10-CM

## 2021-09-17 NOTE — Patient Instructions (Signed)

## 2021-10-28 DIAGNOSIS — I10 Essential (primary) hypertension: Secondary | ICD-10-CM | POA: Diagnosis not present

## 2021-10-28 DIAGNOSIS — Z0000 Encounter for general adult medical examination without abnormal findings: Secondary | ICD-10-CM | POA: Diagnosis not present

## 2021-10-28 DIAGNOSIS — J4530 Mild persistent asthma, uncomplicated: Secondary | ICD-10-CM | POA: Diagnosis not present

## 2021-10-28 DIAGNOSIS — K589 Irritable bowel syndrome without diarrhea: Secondary | ICD-10-CM | POA: Diagnosis not present

## 2021-10-28 DIAGNOSIS — Z23 Encounter for immunization: Secondary | ICD-10-CM | POA: Diagnosis not present

## 2021-10-28 DIAGNOSIS — K219 Gastro-esophageal reflux disease without esophagitis: Secondary | ICD-10-CM | POA: Diagnosis not present

## 2022-02-01 ENCOUNTER — Other Ambulatory Visit: Payer: Self-pay | Admitting: Internal Medicine

## 2022-02-01 DIAGNOSIS — Z1231 Encounter for screening mammogram for malignant neoplasm of breast: Secondary | ICD-10-CM

## 2022-03-02 ENCOUNTER — Ambulatory Visit: Payer: Medicare Other

## 2022-03-09 ENCOUNTER — Other Ambulatory Visit: Payer: Self-pay

## 2022-03-09 ENCOUNTER — Ambulatory Visit
Admission: RE | Admit: 2022-03-09 | Discharge: 2022-03-09 | Disposition: A | Discharge status: Home / Self Care | Payer: Medicare Other | Source: Ambulatory Visit | Attending: Internal Medicine | Admitting: Internal Medicine

## 2022-03-09 DIAGNOSIS — Z1231 Encounter for screening mammogram for malignant neoplasm of breast: Secondary | ICD-10-CM | POA: Diagnosis not present

## 2022-03-10 ENCOUNTER — Other Ambulatory Visit: Payer: Self-pay | Admitting: Internal Medicine

## 2022-03-10 DIAGNOSIS — R928 Other abnormal and inconclusive findings on diagnostic imaging of breast: Secondary | ICD-10-CM

## 2022-03-25 ENCOUNTER — Ambulatory Visit: Payer: Medicare Other

## 2022-03-25 ENCOUNTER — Ambulatory Visit
Admission: RE | Admit: 2022-03-25 | Discharge: 2022-03-25 | Disposition: A | Discharge status: Home / Self Care | Payer: Medicare Other | Source: Ambulatory Visit | Attending: Internal Medicine | Admitting: Internal Medicine

## 2022-03-25 ENCOUNTER — Other Ambulatory Visit: Payer: Self-pay | Admitting: Internal Medicine

## 2022-03-25 DIAGNOSIS — R928 Other abnormal and inconclusive findings on diagnostic imaging of breast: Secondary | ICD-10-CM

## 2022-03-25 DIAGNOSIS — N6489 Other specified disorders of breast: Secondary | ICD-10-CM

## 2022-03-29 ENCOUNTER — Telehealth: Payer: Self-pay

## 2022-03-29 NOTE — Telephone Encounter (Signed)
Encounter opened in error

## 2022-04-06 ENCOUNTER — Ambulatory Visit
Admission: RE | Admit: 2022-04-06 | Discharge: 2022-04-06 | Disposition: A | Discharge status: Home / Self Care | Payer: Medicare Other | Source: Ambulatory Visit | Attending: Internal Medicine | Admitting: Internal Medicine

## 2022-04-06 DIAGNOSIS — C50811 Malignant neoplasm of overlapping sites of right female breast: Secondary | ICD-10-CM | POA: Diagnosis not present

## 2022-04-06 DIAGNOSIS — N6489 Other specified disorders of breast: Secondary | ICD-10-CM

## 2022-04-06 DIAGNOSIS — R928 Other abnormal and inconclusive findings on diagnostic imaging of breast: Secondary | ICD-10-CM | POA: Diagnosis not present

## 2022-04-12 HISTORY — PX: BREAST LUMPECTOMY: SHX2

## 2022-04-13 ENCOUNTER — Other Ambulatory Visit: Payer: Self-pay | Admitting: Surgery

## 2022-04-13 DIAGNOSIS — Z853 Personal history of malignant neoplasm of breast: Secondary | ICD-10-CM

## 2022-04-13 DIAGNOSIS — C50911 Malignant neoplasm of unspecified site of right female breast: Secondary | ICD-10-CM | POA: Diagnosis not present

## 2022-04-13 DIAGNOSIS — Z6834 Body mass index (BMI) 34.0-34.9, adult: Secondary | ICD-10-CM | POA: Insufficient documentation

## 2022-04-13 DIAGNOSIS — N83209 Unspecified ovarian cyst, unspecified side: Secondary | ICD-10-CM | POA: Insufficient documentation

## 2022-04-13 DIAGNOSIS — J4530 Mild persistent asthma, uncomplicated: Secondary | ICD-10-CM | POA: Insufficient documentation

## 2022-04-14 ENCOUNTER — Other Ambulatory Visit: Payer: Self-pay | Admitting: Surgery

## 2022-04-14 DIAGNOSIS — Z853 Personal history of malignant neoplasm of breast: Secondary | ICD-10-CM

## 2022-04-15 ENCOUNTER — Encounter: Payer: Self-pay | Admitting: *Deleted

## 2022-04-15 DIAGNOSIS — Z170 Estrogen receptor positive status [ER+]: Secondary | ICD-10-CM | POA: Insufficient documentation

## 2022-04-16 ENCOUNTER — Telehealth: Payer: Self-pay | Admitting: Hematology and Oncology

## 2022-04-16 ENCOUNTER — Telehealth: Payer: Self-pay | Admitting: Genetic Counselor

## 2022-04-16 ENCOUNTER — Other Ambulatory Visit: Payer: Self-pay | Admitting: Genetic Counselor

## 2022-04-16 DIAGNOSIS — Z170 Estrogen receptor positive status [ER+]: Secondary | ICD-10-CM

## 2022-04-16 NOTE — Telephone Encounter (Signed)
Scheduled appt per 5/4 referral. Pt is aware of appt date and time. Pt is aware to arrive 15 mins prior to appt time and to bring and updated insurance card. Pt is aware of appt location.   ?

## 2022-04-19 ENCOUNTER — Inpatient Hospital Stay (HOSPITAL_BASED_OUTPATIENT_CLINIC_OR_DEPARTMENT_OTHER): Payer: Medicare Other | Admitting: Genetic Counselor

## 2022-04-19 ENCOUNTER — Encounter: Payer: Self-pay | Admitting: *Deleted

## 2022-04-19 ENCOUNTER — Inpatient Hospital Stay: Payer: Medicare Other

## 2022-04-19 ENCOUNTER — Telehealth: Payer: Self-pay | Admitting: *Deleted

## 2022-04-19 ENCOUNTER — Encounter: Payer: Self-pay | Admitting: Genetic Counselor

## 2022-04-19 ENCOUNTER — Inpatient Hospital Stay: Payer: Medicare Other | Attending: Hematology and Oncology | Admitting: Hematology and Oncology

## 2022-04-19 ENCOUNTER — Other Ambulatory Visit: Payer: Self-pay

## 2022-04-19 ENCOUNTER — Encounter: Payer: Self-pay | Admitting: Hematology and Oncology

## 2022-04-19 VITALS — BP 145/71 | HR 61 | Temp 97.8°F | Wt 180.3 lb

## 2022-04-19 DIAGNOSIS — Z8051 Family history of malignant neoplasm of kidney: Secondary | ICD-10-CM | POA: Diagnosis not present

## 2022-04-19 DIAGNOSIS — Z803 Family history of malignant neoplasm of breast: Secondary | ICD-10-CM | POA: Diagnosis not present

## 2022-04-19 DIAGNOSIS — Z800 Family history of malignant neoplasm of digestive organs: Secondary | ICD-10-CM | POA: Diagnosis not present

## 2022-04-19 DIAGNOSIS — C50211 Malignant neoplasm of upper-inner quadrant of right female breast: Secondary | ICD-10-CM | POA: Diagnosis not present

## 2022-04-19 DIAGNOSIS — Z170 Estrogen receptor positive status [ER+]: Secondary | ICD-10-CM | POA: Insufficient documentation

## 2022-04-19 DIAGNOSIS — I10 Essential (primary) hypertension: Secondary | ICD-10-CM | POA: Insufficient documentation

## 2022-04-19 DIAGNOSIS — Z79899 Other long term (current) drug therapy: Secondary | ICD-10-CM | POA: Diagnosis not present

## 2022-04-19 DIAGNOSIS — Z8042 Family history of malignant neoplasm of prostate: Secondary | ICD-10-CM

## 2022-04-19 LAB — COMPREHENSIVE METABOLIC PANEL
ALT: 20 U/L (ref 0–44)
AST: 18 U/L (ref 15–41)
Albumin: 4.2 g/dL (ref 3.5–5.0)
Alkaline Phosphatase: 56 U/L (ref 38–126)
Anion gap: 6 (ref 5–15)
BUN: 20 mg/dL (ref 8–23)
CO2: 29 mmol/L (ref 22–32)
Calcium: 9.8 mg/dL (ref 8.9–10.3)
Chloride: 105 mmol/L (ref 98–111)
Creatinine, Ser: 1 mg/dL (ref 0.44–1.00)
GFR, Estimated: 59 mL/min — ABNORMAL LOW (ref >60)
Glucose, Bld: 100 mg/dL — ABNORMAL HIGH (ref 70–99)
Potassium: 3.5 mmol/L (ref 3.5–5.1)
Sodium: 140 mmol/L (ref 135–145)
Total Bilirubin: 0.6 mg/dL (ref 0.3–1.2)
Total Protein: 6.9 g/dL (ref 6.5–8.1)

## 2022-04-19 LAB — CBC WITH DIFFERENTIAL/PLATELET
Abs Immature Granulocytes: 0.01 10*3/uL (ref 0.00–0.07)
Basophils Absolute: 0 10*3/uL (ref 0.0–0.1)
Basophils Relative: 1 %
Eosinophils Absolute: 0.2 10*3/uL (ref 0.0–0.5)
Eosinophils Relative: 2 %
HCT: 39 % (ref 36.0–46.0)
Hemoglobin: 13.2 g/dL (ref 12.0–15.0)
Immature Granulocytes: 0 %
Lymphocytes Relative: 18 %
Lymphs Abs: 1.3 10*3/uL (ref 0.7–4.0)
MCH: 29.7 pg (ref 26.0–34.0)
MCHC: 33.8 g/dL (ref 30.0–36.0)
MCV: 87.8 fL (ref 80.0–100.0)
Monocytes Absolute: 0.9 10*3/uL (ref 0.1–1.0)
Monocytes Relative: 12 %
Neutro Abs: 4.9 10*3/uL (ref 1.7–7.7)
Neutrophils Relative %: 67 %
Platelets: 243 10*3/uL (ref 150–400)
RBC: 4.44 MIL/uL (ref 3.87–5.11)
RDW: 13.9 % (ref 11.5–15.5)
WBC: 7.3 10*3/uL (ref 4.0–10.5)
nRBC: 0 % (ref 0.0–0.2)

## 2022-04-19 LAB — GENETIC SCREENING ORDER

## 2022-04-19 NOTE — Telephone Encounter (Signed)
Left message for a return phone call to follow up from her new patient appt and assess navigation needs.Contact information left on patient's voicemail. ?

## 2022-04-19 NOTE — Progress Notes (Signed)
Location of Breast Cancer:  ?Malignant neoplasm of upper-inner quadrant of right breast in female, estrogen receptor positive ? ?Histology per Pathology Report:  ?(Definitive pathology pending upcoming surgery) ?04/06/2022 ?Diagnosis ?Breast, right, needle core biopsy, medial ?- INVASIVE DUCTAL CARCINOMA. SEE NOTE ?- ATYPICAL DUCTAL HYPERPLASIA WITH CALCIFICATIONS ? ?Receptor Status: ER(100%), PR (2%), Her2-neu (Negative), Ki-67(1%) ? ?Did patient present with symptoms (if so, please note symptoms) or was this found on screening mammography?: Screening mammogram on 03/09/2022 showed small architectural distortion in the inner right breast.  Diagnostic mammogram on 03/25/2022 confirmed the architectural distortion  ? ?Past/Anticipated interventions by surgeon, if any: ?04/28/2022 ?--Dr. Coralie Keens  ?Scheduled for: RIGHT BREAST LUMPECTOMY WITH RADIOACTIVE SEED LOCALIZATION ? ?Past/Anticipated interventions by medical oncology, if any:  ?Under care of Dr. Arletha Pili Iruku ?04/19/2022 ?We have discussed today about the pathology, early stage, treatment recommendations in general which include surgery followed by adjuvant radiation and adjuvant endocrine therapy.  ?We have also discussed about Oncotype. ?If chemotherapy is needed, this will precede radiation and then after radiation she will continue on antiestrogen therapy. ?We have discussed options for antiestrogen therapy today ?We discussed mechanism of action of Tamoxifen and aromatase inhibitors  ?She will proceed with surgery and will return to clinic for follow-up to discuss Oncotype results as well as adjuvant antiestrogen therapy ? ?Lymphedema issues, if any:  None   ? ?Pain issues, if any:  Patient denies any breast pain. Reports stomach pains for the past several month, as well as bilateral lower extremity discomfort/achiness (encouraged patient to reach out to her PCP for evaluation and possible referral if necessary) ? ?SAFETY ISSUES: ?Prior radiation?  No ?Pacemaker/ICD? No ?Possible current pregnancy? No--hysterectomy ?Is the patient on methotrexate? No ? 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 Met with genetic counselor on 04/19/2022.  ?   ? ? ? ?

## 2022-04-19 NOTE — Progress Notes (Signed)
[name] ?CONSULT NOTE ? ?Patient Care Team: ?Lavone Orn, MD as PCP - General (Internal Medicine) ?Mauro Kaufmann, RN as Oncology Nurse Navigator ?Rockwell Germany, RN as Oncology Nurse Navigator ? ?CHIEF COMPLAINTS/PURPOSE OF CONSULTATION:  ?Newly diagnosed breast cancer ? ?HISTORY OF PRESENTING ILLNESS:  ?Rachel Barrett 76 y.o. female is here because of recent diagnosis of right breast invasive ductal carcinoma ? ?I reviewed her records extensively and collaborated the history with the patient. ? 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 Patient had screening mammogram which showed small architectural distortion in the inner right breast.  Diagnostic mammogram confirmed the architectural distortion and stereotactic biopsy was recommended. ?  ?04/15/2022 Initial Diagnosis  ? Malignant neoplasm of upper-inner quadrant of right breast in female, estrogen receptor positive ([place]) ?  ?04/19/2022 Cancer Staging  ? Staging form: Breast, AJCC 8th Edition ?- Clinical: Stage IA (cT1, cN0, cM0, G1, ER+, PR+, HER2-) - Signed by Benay Pike, MD on 04/19/2022 ?Histologic grading system: 3 grade system ? ?  ?04/20/2022 Pathology Results  ? Pathology results showed invasive ductal carcinoma, grade 1, atypical ductal hyperplasia with calcifications, ER 100% positive strong staining intensity, PR 2% positive strong staining intensity, Ki-67 of 1% and tumor cells are negative for HER2 1+ ?  ? ?Patient arrived to the appointment today with her son.  She is a robust 29 year old, takes care of her husband who is a dialysis patient.  She denies any major medical issues except underlying asthma, well-controlled hypertension and some retinal detachment.  She has had multiple surgeries in the past.  She does have significant family history of breast cancer in her sister, maternal aunt,  multiple maternal uncles with prostate cancer.  She is a never smoker, retired.  Rest of the pertinent 10 point ROS reviewed and negative ? ? ?MEDICAL HISTORY:  ?Past Medical History:  ?Diagnosis Date  ? ALLERGIC RHINITIS 02/17/2010  ? Allergy   ? ASTHMA 02/17/2010  ? Family history of breast cancer   ? Family history of kidney cancer   ? Family history of prostate cancer   ? Family history of stomach cancer   ? GERD 02/17/2010  ? Glaucoma   ? GOITER, MULTINODULAR 02/17/2010  ? HYPERGLYCEMIA 02/22/2011  ? HYPERTENSION 02/17/2010  ? IBS 02/17/2010  ? Osteopenia 11/2018  ? T score -1.9 FRAX 7.9% / 1.4%  ? Retina hole   ? Unspecified disorder of liver 02/22/2011  ? ? ?SURGICAL HISTORY: ?Past Surgical History:  ?Procedure Laterality Date  ? ABDOMINAL HYSTERECTOMY  1989  ? Leiomyoma  ? Bladder Bx  2009  ? BREAST EXCISIONAL BIOPSY Left 1999  ? BREAST SURGERY    ? Breast cyst  ? CHOLECYSTECTOMY    ? EYE SURGERY    ? EYE SURGERY    ? Hole in Retina    ? NOSE SURGERY    ? ? ?SOCIAL HISTORY: ?Social History  ? ?Socioeconomic History  ? Marital status: Married  ?  Spouse name: Not on file  ? Number of children: Not on file  ? Years of education: Not on file  ? Highest education level: Not on file  ?Occupational History  ? Occupation: Administration  ?  Employer: [employer]  ?Tobacco Use  ? Smoking status: Never  ? Smokeless tobacco: Never  ?Vaping Use  ? Vaping Use: Never used  ?Substance and Sexual Activity  ?  Alcohol use: No  ?  Alcohol/week: 0.0 standard drinks  ? Drug use: No  ? Sexual activity: Not Currently  ?  Birth control/protection: Surgical  ?  Comment: 1st intercourse 76 yo-Fewer than 5 partners  ?Other Topics Concern  ? Not on file  ?Social History Narrative  ? Not on file  ? ?Social Determinants of Health  ? ?Financial Resource Strain: Not on file  ?Food Insecurity: Not on file  ?Transportation Needs: Not on file  ?Physical Activity: Not on file  ?Stress: Not on file  ?Social Connections: Not on file  ?Intimate  Partner Violence: Not on file  ? ? ?FAMILY HISTORY: ?Family History  ?Problem Relation Age of Onset  ? Goiter Mother   ?     benign  ? Heart attack Mother   ? Hypertension Father   ? Heart disease Father   ? Hypertension Sister   ? Breast cancer Sister 3  ? Hypertension Brother   ? Lung cancer Brother   ? Cancer Brother   ?     Peritoneal cancer  ? Breast cancer Maternal Aunt   ? Prostate cancer Maternal Uncle   ?     4 maternal uncles  ? Lung cancer Maternal Uncle   ? Stomach cancer Paternal Aunt   ? Liver cancer Paternal Aunt   ? Kidney cancer Paternal Uncle   ? Heart disease Maternal Grandmother   ? Heart disease Maternal Grandfather   ? Heart disease Paternal Grandmother   ? Heart disease Paternal Grandfather   ? Stomach cancer Other   ?     MGMs sister  ? Stomach cancer Cousin   ?     maternal cousin  ? Diabetes Neg Hx   ? ? ?ALLERGIES:  is allergic to other, augmentin [amoxicillin-pot clavulanate], clarithromycin, and bactrim [sulfamethoxazole-trimethoprim]. ? ?MEDICATIONS:  ?Current Outpatient Medications  ?Medication Sig Dispense Refill  ? albuterol (VENTOLIN HFA) 108 (90 Base) MCG/ACT inhaler Inhale 2 puffs into the lungs as needed.      ? amLODipine (NORVASC) 5 MG tablet Take 5 mg by mouth daily.      ? budesonide-formoterol (SYMBICORT) 160-4.5 MCG/ACT inhaler Inhale 2 puffs into the lungs 2 (two) times daily.    ? fluticasone (FLONASE) 50 MCG/ACT nasal spray Place 2 sprays into the nose as needed.      ? latanoprost (XALATAN) 0.005 % ophthalmic solution 1 drop at bedtime.    ? potassium chloride SA (K-DUR,KLOR-CON) 20 MEQ tablet Take 20 mEq by mouth daily.      ? timolol (TIMOPTIC) 0.5 % ophthalmic solution     ? valsartan-hydrochlorothiazide (DIOVAN-HCT) 160-25 MG per tablet Take 1 tablet by mouth daily.      ? ?No current facility-administered medications for this visit.  ? ? ?REVIEW OF SYSTEMS:   ?Constitutional: Denies fevers, chills or abnormal night sweats ?Eyes: Denies blurriness of vision, double  vision or watery eyes ?Ears, nose, mouth, throat, and face: Denies mucositis or sore throat ?Respiratory: Denies cough, dyspnea or wheezes ?Cardiovascular: Denies palpitation, chest discomfort or lower extremity swelling ?Gastrointestinal:  Denies nausea, heartburn or change in bowel habits ?Skin: Denies abnormal skin rashes ?Lymphatics: Denies new lymphadenopathy or easy bruising ?Neurological:Denies numbness, tingling or new weaknesses ?Behavioral/Psych: Mood is stable, no new changes  ?Breast: Denies any palpable lumps or discharge ?All other systems were reviewed with the patient and are negative. ? ?PHYSICAL EXAMINATION: ?ECOG PERFORMANCE STATUS: 0 - Asymptomatic ? ?Vitals:  ? 04/19/22 1042  ?BP: (!) 145/71  ?  Pulse: 61  ?Temp: 97.8 ?F (36.6 ?C)  ?SpO2: 99%  ? ?Filed Weights  ? 04/19/22 1042  ?Weight: 180 lb 4.8 oz (81.8 kg)  ? ? ?GENERAL:alert, no distress and comfortable ?SKIN: skin color, texture, turgor are normal, no rashes or significant lesions ?EYES: normal, conjunctiva are pink and non-injected, sclera clear ?OROPHARYNX:no exudate, no erythema and lips, buccal mucosa, and tongue normal  ?NECK: supple, thyroid normal size, non-tender, without nodularity ?LYMPH:  no palpable lymphadenopathy in the cervical, axillary or inguinal ?LUNGS: clear to auscultation and percussion with normal breathing effort ?HEART: regular rate & rhythm and no murmurs and no lower extremity edema ?ABDOMEN:abdomen soft, non-tender and normal bowel sounds ?Musculoskeletal:no cyanosis of digits and no clubbing  ?PSYCH: alert & oriented x 3 with fluent speech ?NEURO: no focal motor/sensory deficits ? ?LABORATORY DATA:  ?I have reviewed the data as listed ?Lab Results  ?Component Value Date  ? WBC 7.3 04/19/2022  ? HGB 13.2 04/19/2022  ? HCT 39.0 04/19/2022  ? MCV 87.8 04/19/2022  ? PLT 243 04/19/2022  ? ?Lab Results  ?Component Value Date  ? NA 140 04/19/2022  ? K 3.5 04/19/2022  ? CL 105 04/19/2022  ? CO2 29 04/19/2022   ? ? ?RADIOGRAPHIC STUDIES: ?I have personally reviewed the radiological reports and agreed with the findings in the report. ? ?ASSESSMENT AND PLAN:  ?Malignant neoplasm of upper-inner quadrant of right breast in fe

## 2022-04-19 NOTE — Progress Notes (Signed)
?Radiation Oncology         [phone] ?________________________________ ? ?Initial Outpatient Consultation ? ?Name: Rachel Barrett MRN: [id number]  ?Date: 04/20/2022  DOB: 11/01/1946 ? ?JO:INOMVEH, Rachel Reichmann, MD  Coralie Keens, MD  ? ?REFERRING PHYSICIAN: Coralie Keens, MD ? ?DIAGNOSIS:  ?  ICD-10-CM   ?1. Malignant neoplasm of upper-inner quadrant of right breast in female, estrogen receptor positive ([place])  C50.211 Ambulatory referral to Social Work  ? Z17.0   ?  ? ? ?Right Breast UIQ Invasive Ductal Carcinoma, ER+ / PR+ / Her2-, Grade 1 ? ? Cancer Staging  ?Malignant neoplasm of upper-inner quadrant of right breast in female, estrogen receptor positive ([place]) ?Staging form: Breast, AJCC 8th Edition ?- Clinical: Stage IA (cT1, cN0, cM0, G1, ER+, PR+, HER2-) - Signed by Benay Pike, MD on 04/19/2022 ? ?CHIEF COMPLAINT: Here to discuss management of right breast cancer ? ?HISTORY OF PRESENT ILLNESS::Rachel Barrett is a 76 y.o. female who presented with a right breast abnormality on the following imaging: bilateral screening mammogram on the date of 03/09/22.  No symptoms, if any, were reported at that time. Diagnostic right breast mammogram on 03/25/22 further revealed a small architectural distortion within the inner right breast. No evidence of axillary adenopathy was noted on mammogram, though she has not had an ultrasound of the axilla. ? ?Medial right breast biopsy on date of 04/06/22 showed grade 1 invasive ductal carcinoma measuring 1.1 cm in the greatest linear dimension, with atypical ductal hyperplasia and calcifications.  ER status: 100% positive; PR status 2% positive (both with strong staining intensity); Proliferation marker Ki67 at 1%; Her2 status negative; Grade 1. No lymph nodes were examined.  ? ?Following evaluation and discussion with Dr. Ninfa Linden on 04/13/22, the patient has agreed to proceed with breast conserving surgery (scheduled for 04/28/22). Dr. Ninfa Linden will order a pre-operative [place]  to assess the need for SLN biopsies.   ? ?The patient also met with Dr. Chryl Heck yesterday and was recommended Oncotype testing. The patient will return to her following surgery to discuss Oncotype results, and to further discuss the role of adjuvant antiestrogen therapy.  ? ?She reports some chronic abdominal pain.  She believes her last colonoscopy was about 8 years ago.  Her brother had peritoneal cancer so this is somewhat concerning to her.  She has pain in her shins at night when she lies down in bed.  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  She has met with genetic counseling. ? ?PREVIOUS RADIATION THERAPY: No ? ?PAST MEDICAL HISTORY:  has a past medical history of ALLERGIC RHINITIS (02/17/2010), Allergy, ASTHMA (02/17/2010), Family history of breast cancer, Family history of kidney cancer, Family history of prostate cancer, Family history of stomach cancer, GERD (02/17/2010), Glaucoma, GOITER, MULTINODULAR (02/17/2010), HYPERGLYCEMIA (02/22/2011), HYPERTENSION (02/17/2010), IBS (02/17/2010), Osteopenia (11/2018), Retina hole, and Unspecified disorder of liver (02/22/2011).   ? ?PAST SURGICAL HISTORY: ?Past Surgical History:  ?Procedure Laterality Date  ? ABDOMINAL HYSTERECTOMY  1989  ? Leiomyoma  ? Bladder Bx  2009  ? BREAST EXCISIONAL BIOPSY Left 1999  ? BREAST SURGERY    ? Breast cyst  ? CHOLECYSTECTOMY    ? EYE SURGERY    ? EYE SURGERY    ? Hole in Retina    ? NOSE SURGERY    ? ? ?FAMILY HISTORY: family history includes Breast cancer in her maternal aunt; Breast cancer (age of onset: 23) in her sister; Cancer in her brother; Goiter in her  mother; Heart attack in her mother; Heart disease in her father, maternal grandfather, maternal grandmother, paternal grandfather, and paternal grandmother; Hypertension in her brother, father, and sister; Kidney cancer in her paternal uncle; Liver cancer in her paternal aunt; Lung cancer in her brother and maternal uncle; Prostate cancer in her  maternal uncle; Stomach cancer in her cousin, paternal aunt, and another family member. ? ?SOCIAL HISTORY:  reports that she has never smoked. She has never used smokeless tobacco. She reports that she does not drink alcohol and does not use drugs. ? ?ALLERGIES: Other, Augmentin [amoxicillin-pot clavulanate], Clarithromycin, Diltiazem hcl, Terazosin, and Bactrim [sulfamethoxazole-trimethoprim] ? ?MEDICATIONS:  ?Current Outpatient Medications  ?Medication Sig Dispense Refill  ? albuterol (VENTOLIN HFA) 108 (90 Base) MCG/ACT inhaler Inhale 2 puffs into the lungs as needed.      ? amLODipine (NORVASC) 5 MG tablet Take 5 mg by mouth daily.      ? budesonide-formoterol (SYMBICORT) 160-4.5 MCG/ACT inhaler Inhale 2 puffs into the lungs 2 (two) times daily.    ? fluticasone (FLONASE) 50 MCG/ACT nasal spray Place 2 sprays into the nose as needed.      ? latanoprost (XALATAN) 0.005 % ophthalmic solution 1 drop at bedtime.    ? omeprazole (PRILOSEC) 20 MG capsule Take 20 mg by mouth daily as needed.    ? potassium chloride SA (K-DUR,KLOR-CON) 20 MEQ tablet Take 20 mEq by mouth daily.      ? timolol (TIMOPTIC) 0.5 % ophthalmic solution     ? valsartan-hydrochlorothiazide (DIOVAN-HCT) 160-25 MG per tablet Take 1 tablet by mouth daily.      ? ?No current facility-administered medications for this encounter.  ? ? ?REVIEW OF SYSTEMS: As above in HPI. ?  ?PHYSICAL EXAM:  height is 5' 3.5" (1.613 m) and weight is 179 lb 4 oz (81.3 kg). Her temporal temperature is 97.2 ?F (36.2 ?C) (abnormal). Her blood pressure is 147/90 (abnormal) and her pulse is 64. Her respiration is 18 and oxygen saturation is 97%.   ?General: Alert and oriented, in no acute distress ?HEENT: Head is normocephalic. Extraocular movements are intact.  ?Heart: Regular in rate and rhythm with no murmurs, rubs, or gallops. ?Chest: Clear to auscultation bilaterally, with no rhonchi, wheezes, or rales. ?Abdomen: Soft, nontender, nondistended, with no rigidity or  guarding. ?Extremities: No cyanosis or edema.  No calf tenderness to palpation. ?Skin: No concerning lesions. ?Musculoskeletal: symmetric strength and muscle tone throughout. ?Neurologic: Cranial nerves II through XII are grossly intact. No obvious focalities. Speech is fluent. Coordination is intact. ?Psychiatric: Judgment and insight are intact. Affect is appropriate. ?Breasts: There is a palpable mass at the site of biopsy in the 3:00 region of the right breast, measuring about 2 cm. No other palpable masses appreciated in the breasts or axillae bilaterally. ? ? ?ECOG = 1 ? ?0 - Asymptomatic (Fully active, able to carry on all predisease activities without restriction) ? ?1 - Symptomatic but completely ambulatory (Restricted in physically strenuous activity but ambulatory and able to carry out work of a light or sedentary nature. For example, light housework, office work) ? ?2 - Symptomatic, <50% in bed during the day (Ambulatory and capable of all self care but unable to carry out any work activities. Up and about more than 50% of waking hours) ? ?3 - Symptomatic, >50% in bed, but not bedbound (Capable of only limited self-care, confined to bed or chair 50% or more of waking hours) ? ?4 - Bedbound (Completely disabled. Cannot carry on any  self-care. Totally confined to bed or chair) ? ?5 - Death ? ? Oken MM, Creech RH, Tormey DC, et al. [phone]). "Toxicity and response criteria of the [facility]". [place] Oncol. 5 (6): 649-55 ? ? ?LABORATORY DATA:  ?Lab Results  ?Component Value Date  ? WBC 7.3 04/19/2022  ? HGB 13.2 04/19/2022  ? HCT 39.0 04/19/2022  ? MCV 87.8 04/19/2022  ? PLT 243 04/19/2022  ? ?CMP  ?   ?Component Value Date/Time  ? NA 140 04/19/2022 1125  ? K 3.5 04/19/2022 1125  ? CL 105 04/19/2022 1125  ? CO2 29 04/19/2022 1125  ? GLUCOSE 100 (H) 04/19/2022 1125  ? BUN 20 04/19/2022 1125  ? CREATININE 1.00 04/19/2022 1125  ? CREATININE 0.90 01/05/2016 1627  ? CALCIUM 9.8  04/19/2022 1125  ? PROT 6.9 04/19/2022 1125  ? ALBUMIN 4.2 04/19/2022 1125  ? AST 18 04/19/2022 1125  ? ALT 20 04/19/2022 1125  ? ALKPHOS 56 04/19/2022 1125  ? BILITOT 0.6 04/19/2022 1125  ? GFRNONAA 59 (L) 04/19/2022

## 2022-04-19 NOTE — Progress Notes (Signed)
REFERRING PROVIDER: ?Coralie Keens, MD ?[address] ?[address] ?[place],  [place] [address] ? ?PRIMARY PROVIDER:  ?Lavone Orn, MD ? ?PRIMARY REASON FOR VISIT:  ?1. Family history of breast cancer   ?2. Family history of prostate cancer   ?3. Family history of stomach cancer   ?4. Family history of kidney cancer   ?5. Malignant neoplasm of upper-inner quadrant of right breast in female, estrogen receptor positive ([place])   ? ? ? ?HISTORY OF PRESENT ILLNESS:   ?Ms. Kusek, a 77 y.o. female, was seen for a [facility] cancer genetics consultation at the request of Dr. Ninfa Linden due to a personal and family history of cancer.  Ms. Schmuck presents to clinic today to discuss the possibility of a hereditary predisposition to cancer, genetic testing, and to further clarify her future cancer risks, as well as potential cancer risks for family members.  ? ?In April 2023, at the age of 41, Ms. Wojnar was diagnosed with breast cancer.  The treatment plan includes lumpectomy, tamoxifen and radiation.  ?   ? ?CANCER HISTORY:  ?Oncology History  ? No history exists.  ? ? ? ?RISK FACTORS:  ?Menarche was at age 23-13.  ?First live birth at age 21.  ?OCP use for approximately 0 years.  ?Ovaries intact: yes.  ?Hysterectomy: yes.  ?Menopausal status: postmenopausal.  ?HRT use: 0 years. ?Colonoscopy: yes; normal. ?Mammogram within the last year: yes. ?Number of breast biopsies: 1. ?Up to date with pelvic exams: yes. ?Any excessive radiation exposure in the past: no ? ?Past Medical History:  ?Diagnosis Date  ? ALLERGIC RHINITIS 02/17/2010  ? Allergy   ? ASTHMA 02/17/2010  ? Family history of breast cancer   ? Family history of kidney cancer   ? Family history of prostate cancer   ? Family history of stomach cancer   ? GERD 02/17/2010  ? Glaucoma   ? GOITER, MULTINODULAR 02/17/2010  ? HYPERGLYCEMIA 02/22/2011  ? HYPERTENSION 02/17/2010  ? IBS 02/17/2010  ? Osteopenia 11/2018  ? T score -1.9 FRAX 7.9% / 1.4%  ? Retina hole   ? Unspecified  disorder of liver 02/22/2011  ? ? ?Past Surgical History:  ?Procedure Laterality Date  ? ABDOMINAL HYSTERECTOMY  1989  ? Leiomyoma  ? Bladder Bx  2009  ? BREAST EXCISIONAL BIOPSY Left 1999  ? BREAST SURGERY    ? Breast cyst  ? CHOLECYSTECTOMY    ? EYE SURGERY    ? EYE SURGERY    ? Hole in Retina    ? NOSE SURGERY    ? ? ?Social History  ? ?Socioeconomic History  ? Marital status: Married  ?  Spouse name: Not on file  ? Number of children: Not on file  ? Years of education: Not on file  ? Highest education level: Not on file  ?Occupational History  ? Occupation: Administration  ?  Employer: [employer]  ?Tobacco Use  ? Smoking status: Never  ? Smokeless tobacco: Never  ?Vaping Use  ? Vaping Use: Never used  ?Substance and Sexual Activity  ? Alcohol use: No  ?  Alcohol/week: 0.0 standard drinks  ? Drug use: No  ? Sexual activity: Not Currently  ?  Birth control/protection: Surgical  ?  Comment: 1st intercourse 76 yo-Fewer than 5 partners  ?Other Topics Concern  ? Not on file  ?Social History Narrative  ? Not on file  ? ?Social Determinants of Health  ? ?Financial Resource Strain: Not on file  ?Food Insecurity:  Not on file  ?Transportation Needs: Not on file  ?Physical Activity: Not on file  ?Stress: Not on file  ?Social Connections: Not on file  ?  ? ?FAMILY HISTORY:  ?We obtained a detailed, 4-generation family history.  Significant diagnoses are listed below: ?Family History  ?Problem Relation Age of Onset  ? Goiter Mother   ?     benign  ? Heart attack Mother   ? Hypertension Father   ? Heart disease Father   ? Hypertension Sister   ? Breast cancer Sister 61  ? Hypertension Brother   ? Lung cancer Brother   ? Cancer Brother   ?     Peritoneal cancer  ? Breast cancer Maternal Aunt   ? Prostate cancer Maternal Uncle   ?     4 maternal uncles  ? Lung cancer Maternal Uncle   ? Stomach cancer Paternal Aunt   ? Liver cancer Paternal Aunt   ? Kidney cancer Paternal Uncle   ? Heart disease Maternal Grandmother   ? Heart  disease Maternal Grandfather   ? Heart disease Paternal Grandmother   ? Heart disease Paternal Grandfather   ? Stomach cancer Other   ?     MGMs sister  ? Stomach cancer Cousin   ?     maternal cousin  ? Diabetes Neg Hx   ? ? ? ?The patient has one son who is cancer free.  The patient has three sisters and three brothers. One sister had breast cancer at 75, one brother had NSCLC and a second brother had peritoneal cancer.  The parents are both deceased. ? ?The patient's father died of heart disease.  He had seven sisters and four brothers.  One sister had liver cancer, another had stomach cancer and one brother had kidney cancer.  The paternal grandparents died of non cancer related issues. ? ?The patient's mother died of a heart attack.  She had two sisters and six brothers.  Four brothers had prostate cancer, one sister had breast cancer and on brother had lung cancer.  The maternal grandparents are deceased from non-cancer related issues.  The MGM's sister had stomach cancer. ? ?Ms. Palm is unaware of previous family history of genetic testing for hereditary cancer risks. Patient's maternal ancestors are of African American descent, and paternal ancestors are of African American descent. There is no reported Ashkenazi Jewish ancestry. There is no known consanguinity. ? ?GENETIC COUNSELING ASSESSMENT: Ms. Cwynar is a 76 y.o. female with a personal and family history of cancer which is somewhat suggestive of a hereditary cancer syndrome and predisposition to cancer given the combination of cancer. We, therefore, discussed and recommended the following at today's visit.  ? ?DISCUSSION: We discussed that, in general, most cancer is not inherited in families, but instead is sporadic or familial. Sporadic cancers occur by chance and typically happen at older ages (>50 years) as this type of cancer is caused by genetic changes acquired during an individual?s lifetime. Some families have more cancers than would be  expected by chance; however, the ages or types of cancer are not consistent with a known genetic mutation or known genetic mutations have been ruled out. This type of familial cancer is thought to be due to a combination of multiple genetic, environmental, hormonal, and lifestyle factors. While this combination of factors likely increases the risk of cancer, the exact source of this risk is not currently identifiable or testable. ? ?We discussed that 5 - 10% of breast cancer  is hereditary, with most cases associated with BRCA mutations.  There are other genes that can be associated with hereditary breast cancer syndromes.  These include ATM, CHEK2 and PALB2.  We discussed that testing is beneficial for several reasons including knowing how to follow individuals after completing their treatment, identifying whether potential treatment options such as PARP inhibitors would be beneficial, and understand if other family members could be at risk for cancer and allow them to undergo genetic testing.  ? ?We reviewed the characteristics, features and inheritance patterns of hereditary cancer syndromes. We also discussed genetic testing, including the appropriate family members to test, the process of testing, insurance coverage and turn-around-time for results. We discussed the implications of a negative, positive and/or variant of uncertain significant result. In order to get genetic test results in a timely manner so that Ms. Galdamez can use these genetic test results for surgical decisions, we recommended Ms. Christopher pursue genetic testing for the BRCAPlus. Once complete, we recommend Ms. Gonder pursue reflex genetic testing to the CancerNext-Expanded+RNAinsight gene panel.  ? ?The CancerNext-Expanded gene panel offered by [facility] and includes sequencing and rearrangement analysis for the following 77 genes: AIP, ALK, APC*, ATM*, AXIN2, BAP1, BARD1, BLM, BMPR1A, BRCA1*, BRCA2*, BRIP1*, CDC73, CDH1*, CDK4, CDKN1B,  CDKN2A, CHEK2*, CTNNA1, DICER1, FANCC, FH, FLCN, GALNT12, KIF1B, LZTR1, MAX, MEN1, MET, MLH1*, MSH2*, MSH3, MSH6*, MUTYH*, NBN, NF1*, NF2, NTHL1, PALB2*, PHOX2B, PMS2*, POT1, PRKAR1A, PTCH1, PTEN*, RAD51C*, RAD51D*,

## 2022-04-19 NOTE — Assessment & Plan Note (Signed)
This is a very pleasant 76 year old female patient with newly diagnosed right breast invasive ductal carcinoma, grade 1, ER +100% strong staining, PR 2% positive strong staining, Ki-67 of 1% and HER2 negative who is scheduled for right partial mastectomy on Apr 28, 2022 by Dr. Ninfa Linden here for an initial visit with medical oncology with her son.  ?We have discussed today about the pathology, early stage, treatment recommendations in general which include surgery followed by adjuvant radiation and adjuvant endocrine therapy.  We have also discussed about Oncotype. ? ?We have discussed about Oncotype Dx score which is a well validated prognostic scoring system which can predict outcome with endocrine therapy alone and whether chemotherapy reduces recurrence.  Typically in patients with ER positive cancers that are node negative if the RS score is high typically greater than or equal to 26, chemotherapy is recommended.  ?In women with intermediate recurrence score younger than 22, there can still be some role for chemotherapy in addition to endocrine therapy especially if the recurrence score is between 21-25. ?If chemotherapy is needed, this will precede radiation and then after radiation she will continue on antiestrogen therapy. ? ?We have discussed the following details about antiestrogen therapy. We have discussed options for antiestrogen therapy today ? ?With regards to Tamoxifen, we discussed that this is a SERM, selective estrogen receptor modulator. We discussed mechanism of action of Tamoxifen, adverse effects on Tamoxifen including but not limited to post menopausal symptoms, increased risk of DVT/PE, increased risk of endometrial cancer, questionable cataracts with long term use and increased risk of cardiovascular events in the study which was not statistically significant. A benefit from Tamoxifen would be improvement in bone density. ?With regards to aromatase inhibitors, we discussed mechanism of  action, adverse effects including but not limited to post menopausal symptoms, arthralgias, myalgias, increased risk of cardiovascular events and bone loss.  ? ?She will proceed with surgery and will return to clinic for follow-up to discuss Oncotype results as well as adjuvant antiestrogen therapy. ? ?Thank you for consulting [place] the care of this patient.  Please not hesitate to contact us with any additional questions or concerns. ? ? ? ?

## 2022-04-20 ENCOUNTER — Encounter: Payer: Self-pay | Admitting: Radiation Oncology

## 2022-04-20 ENCOUNTER — Ambulatory Visit
Admission: RE | Admit: 2022-04-20 | Discharge: 2022-04-20 | Disposition: A | Discharge status: Home / Self Care | Payer: Medicare Other | Source: Ambulatory Visit | Attending: Radiation Oncology | Admitting: Radiation Oncology

## 2022-04-20 VITALS — BP 147/90 | HR 64 | Temp 97.2°F | Resp 18 | Ht 63.5 in | Wt 179.2 lb

## 2022-04-20 DIAGNOSIS — Z79899 Other long term (current) drug therapy: Secondary | ICD-10-CM | POA: Insufficient documentation

## 2022-04-20 DIAGNOSIS — I10 Essential (primary) hypertension: Secondary | ICD-10-CM | POA: Diagnosis not present

## 2022-04-20 DIAGNOSIS — Z800 Family history of malignant neoplasm of digestive organs: Secondary | ICD-10-CM | POA: Insufficient documentation

## 2022-04-20 DIAGNOSIS — K769 Liver disease, unspecified: Secondary | ICD-10-CM | POA: Diagnosis not present

## 2022-04-20 DIAGNOSIS — Z803 Family history of malignant neoplasm of breast: Secondary | ICD-10-CM | POA: Diagnosis not present

## 2022-04-20 DIAGNOSIS — M8580 Other specified disorders of bone density and structure, unspecified site: Secondary | ICD-10-CM | POA: Diagnosis not present

## 2022-04-20 DIAGNOSIS — R109 Unspecified abdominal pain: Secondary | ICD-10-CM | POA: Diagnosis not present

## 2022-04-20 DIAGNOSIS — Z170 Estrogen receptor positive status [ER+]: Secondary | ICD-10-CM | POA: Insufficient documentation

## 2022-04-20 DIAGNOSIS — C50211 Malignant neoplasm of upper-inner quadrant of right female breast: Secondary | ICD-10-CM | POA: Insufficient documentation

## 2022-04-21 ENCOUNTER — Encounter (HOSPITAL_BASED_OUTPATIENT_CLINIC_OR_DEPARTMENT_OTHER): Payer: Self-pay | Admitting: Surgery

## 2022-04-21 ENCOUNTER — Encounter (HOSPITAL_BASED_OUTPATIENT_CLINIC_OR_DEPARTMENT_OTHER)
Admission: RE | Admit: 2022-04-21 | Discharge: 2022-04-21 | Disposition: A | Discharge status: Home / Self Care | Payer: Medicare Other | Source: Ambulatory Visit | Attending: Surgery | Admitting: Surgery

## 2022-04-21 ENCOUNTER — Other Ambulatory Visit: Payer: Self-pay

## 2022-04-21 ENCOUNTER — Telehealth: Payer: Self-pay | Admitting: Licensed Clinical Social Worker

## 2022-04-21 DIAGNOSIS — I2510 Atherosclerotic heart disease of native coronary artery without angina pectoris: Secondary | ICD-10-CM | POA: Diagnosis not present

## 2022-04-21 DIAGNOSIS — Z01810 Encounter for preprocedural cardiovascular examination: Secondary | ICD-10-CM | POA: Insufficient documentation

## 2022-04-21 NOTE — Progress Notes (Signed)

## 2022-04-21 NOTE — Telephone Encounter (Signed)
[facility] ?Clinical Social Work ? ?Clinical Social Work was referred by medical provider for assessment of psychosocial needs.  Clinical Social Worker  attempted to contact pt by phone   to offer support and assess for needs.   ? ?No answer. Phone rang and then went to busy tone, no voicemail available. CSW will attempt to contact pt again at a later time. ? ? ? ? ? ?Kenyatta Keidel E Blia Totman, LCSW  ?Clinical Social Worker ?[place] ?      ? ?

## 2022-04-22 ENCOUNTER — Encounter: Payer: Self-pay | Admitting: Licensed Clinical Social Worker

## 2022-04-22 NOTE — Progress Notes (Signed)
[facility] Clinical Social Work  ?Initial Assessment ? ? ?Rachel Barrett is a 76 y.o. year old female contacted by phone. Clinical Social Work was referred by nurse for assessment of psychosocial needs.  ? ?SDOH (Social Determinants of Health) assessments performed: Yes ?SDOH Interventions   ? ?Flowsheet Row Most Recent Value  ?SDOH Interventions   ?Food Insecurity Interventions Intervention Not Indicated  ?Transportation Interventions Intervention Not Indicated  ? ?  ?  ?Distress Screen completed: No ?   ? View : No data to display.  ?  ?  ?  ? ? ? ? ?Family/Social Information:  ?Housing Arrangement: patient lives with husband . Husband is on dialysis 3x/week since 2017 (and prior to kidney transplant in 2013) ?Family members/support persons in your life? Family- adult son lives nearby, friends ?Transportation concerns: no  ?Employment: Retired. Income source: [employer] [place] ?Financial concerns: No ?Type of concern: None ?Food access concerns: no ?Religious or spiritual practice: not addressed ?Services Currently in place:  n/a ? ?Coping/ Adjustment to diagnosis: ?Patient understands treatment plan and what happens next? Yes. Hopes everything goes well as she is primary caregiver for husband on dialysis. Her son can help when she asks him ?Concerns about diagnosis and/or treatment: How I will care for other members of my family ?Patient reported stressors: Partner ?Hopes and priorities: hopes to have treatment go smoothly and quickly so she can continue to care for her husband ?Current coping skills/ strengths: Capable of independent living , Motivation for treatment/growth , and Supportive family/friends  ? ? ? SUMMARY: ?Current SDOH Barriers:  ?Limited resources to help with caregiving responsibilities ? ?Clinical Social Work Clinical Goal(s):  ?Patient will have increase in support for caring for husband ? ?Interventions: ?Discussed common feeling and emotions when being diagnosed with cancer, and the  importance of support during treatment ?Informed patient of the support team roles and support services at [facility] ?Provided CSW contact information and encouraged patient to call with any questions or concerns ?Encouraged pt to ask son for assistance during this time. Recommended asking spouse's doctors/ dialysis center for referral for in-home/ caregiving assistance during this time ? ? ?Follow Up Plan: Patient will contact CSW with any support or resource needs ?Patient verbalizes understanding of plan: Yes ? ? ? ?Heidi Maclin E Tashima Scarpulla, LCSW ?

## 2022-04-23 ENCOUNTER — Other Ambulatory Visit: Payer: Self-pay | Admitting: Surgery

## 2022-04-23 DIAGNOSIS — C50911 Malignant neoplasm of unspecified site of right female breast: Secondary | ICD-10-CM

## 2022-04-26 ENCOUNTER — Ambulatory Visit
Admission: RE | Admit: 2022-04-26 | Discharge: 2022-04-26 | Disposition: A | Discharge status: Home / Self Care | Payer: Medicare Other | Source: Ambulatory Visit | Attending: Surgery | Admitting: Surgery

## 2022-04-26 DIAGNOSIS — C50911 Malignant neoplasm of unspecified site of right female breast: Secondary | ICD-10-CM

## 2022-04-26 DIAGNOSIS — Z01818 Encounter for other preprocedural examination: Secondary | ICD-10-CM | POA: Diagnosis not present

## 2022-04-27 ENCOUNTER — Ambulatory Visit
Admission: RE | Admit: 2022-04-27 | Discharge: 2022-04-27 | Disposition: A | Discharge status: Home / Self Care | Payer: Medicare Other | Source: Ambulatory Visit | Attending: Surgery | Admitting: Surgery

## 2022-04-27 DIAGNOSIS — Z853 Personal history of malignant neoplasm of breast: Secondary | ICD-10-CM

## 2022-04-27 DIAGNOSIS — C50911 Malignant neoplasm of unspecified site of right female breast: Secondary | ICD-10-CM | POA: Diagnosis not present

## 2022-04-27 NOTE — H&P (Signed)
?REFERRING PHYSICIAN: Lavone Orn ? ?PROVIDER: Beverlee Nims, MD ? ?MRN: [id number] ?DOB: 12-24-1945 ?DATE OF ENCOUNTER: 04/13/2022 ?Subjective  ? ?Chief Complaint: New Consultation (Right Breast Cancer) ? ? ?History of Present Illness: ?Rachel Barrett is a 76 y.o. female who is seen today as an office consultation for evaluation of New Consultation (Right Breast Cancer) ?.  ? ?This is a 76 year old female who is accompanied by her son and is here today for recent evaluation of right breast cancer. She has had a benign cyst excised many years ago from the left breast. She was recently seen on screening mammography to have a distortion in the right breast. She underwent a biopsy showing invasive ductal carcinoma. Receptor status is pending. She denies nipple discharge. She has not had a ultrasound yet regarding the axilla. ?Her sister is currently being treated for triple negative breast cancer. ?She has no cardiopulmonary issues and is otherwise without complaints. ? ?Review of Systems: ?A complete review of systems was obtained from the patient. I have reviewed this information and discussed as appropriate with the patient. See HPI as well for other ROS. ? ?ROS  ? ?Medical History: ?Past Medical History:  ?Diagnosis Date  ? Arthritis  ? Asthma, unspecified asthma severity, unspecified whether complicated, unspecified whether persistent  ? GERD (gastroesophageal reflux disease)  ? History of cancer  ? Hypertension  ? ?Patient Active Problem List  ?Diagnosis  ? Allergic rhinitis  ? Anterior uveitis  ? Anxiety state  ? Asthma  ? Big thyroid  ? Body mass index (BMI) 34.0-34.9, adult  ? Cystitis cystica  ? Disorder of liver  ? Essential (primary) hypertension  ? Fibroid  ? Gastro-esophageal reflux disease without esophagitis  ? Hematuria  ? Hypercalcemia  ? Intermediate uveitis, bilateral  ? Irritable bowel syndrome  ? Lattice degeneration  ? Mild persistent asthma, uncomplicated  ? Nontoxic multinodular goiter  ?  Osteopenia  ? Other abnormal glucose  ? Ovarian cyst  ? Paranoia (CMS-HCC)  ? Primary open angle glaucoma  ? Retina hole  ? Vasomotor rhinitis  ? ?Past Surgical History:  ?Procedure Laterality Date  ? CHOLECYSTECTOMY 2002  ? REPAIR COMPLEX RETINAL DETACHMENT 2014  ? Nasal Surgery  ?x 4  ? ? ?Allergies  ?Allergen Reactions  ? Other Other (See Comments)  ?bacitacin-races heart ?Other reaction(s): Other (See Comments) ?bacitacin-races heart ? ? Amoxicillin-Pot Clavulanate Other (See Comments) and Vomiting  ? Clarithromycin Unknown  ? Clonidine Hcl Unknown  ? Diltiazem Hcl Headache  ? Terazosin Swelling  ? Sulfamethoxazole Rash  ? Sulfamethoxazole-Trimethoprim Palpitations  ? ?Current Outpatient Medications on File Prior to Visit  ?Medication Sig Dispense Refill  ? albuterol 90 mcg/actuation inhaler USE 2 INHALATIONS BY MOUTH EVERY 4 HOURS AS NEEDED  ? amLODIPine (NORVASC) 5 MG tablet Take 1 tablet by mouth once daily  ? budesonide-formoteroL (SYMBICORT) 160-4.5 mcg/actuation inhaler Inhale into the lungs  ? potassium chloride (KLOR-CON) 20 mEq packet 20 mEq every morning.  ? timoloL maleate (TIMOPTIC) 0.5 % ophthalmic solution  ? valsartan-hydroCHLOROthiazide (DIOVAN-HCT) 160-25 mg tablet Take 1 tablet by mouth once daily  ? ?No current facility-administered medications on file prior to visit.  ? ?History reviewed. No pertinent family history.  ? ?Social History  ? ?Tobacco Use  ?Smoking Status Never  ?Smokeless Tobacco Never  ? ? ?Social History  ? ?Socioeconomic History  ? Marital status: Married  ?Tobacco Use  ? Smoking status: Never  ? Smokeless tobacco: Never  ?Vaping Use  ? Vaping Use: Unknown  ?  Substance and Sexual Activity  ? Alcohol use: Not Currently  ? Drug use: Never  ? ?Objective:  ? ?Vitals:  ?04/13/22 1006  ?BP: (!) 150/70  ?Pulse: 88  ?Temp: 36.8 ?C (98.3 ?F)  ?SpO2: 99%  ?Weight: 81.9 kg (180 lb 9.6 oz)  ?Height: 162.6 cm (_0 )  ? ?Body mass index is 31 kg/m?. ? ?Physical Exam  ? ?She appears well on  exam ? ?There is ecchymosis in the right breast from the previous biopsy back and palpate no masses in the breast. The nipple areolar complex is normal. ? ?There is no axillary adenopathy ? ?Labs, Imaging and Diagnostic Testing: ?I have reviewed her mammogram, pathology report, and history ? ?Assessment and Plan:  ? ?Diagnoses and all orders for this visit: ? ?Invasive ductal carcinoma of breast, female, right (CMS-HCC) ?- Ambulatory Referral to Oncology-Medical ?- Ambulatory Referral to Radiation Oncology ?- US breast limited right ? ? ? ?At this point had a long discussion with the patient and her son regarding breast cancer. We discussed a multi modality approach to cancer. From a surgical standpoint we discussed breast conservation versus mastectomy. She is interested in breast conservation. I next explained proceeding with a radioactive seed guided right breast lumpectomy. I explained the procedure in detail. ?She will need referrals to medical and radiation oncology and we will need to get a preoperative ultrasound of her axilla to determine whether or not a sentinel node biopsy is necessary. I discussed this with her as well. ?I will call them back and we know the results of the receptor status as well. ? ?We did discuss the risk of surgery which includes but is not limited to bleeding, infection, the need for further surgery if margins are positive, cardiopulmonary issues, postoperative recovery, etc. They agree with the current plans.  ? ?Addendum: ultrasound of the axilla is negative for enlarged nodes.  IHer cancer is 100% ER positive, 2% PR positive, HER2 -, and Ki6 is 1% ?

## 2022-04-28 ENCOUNTER — Ambulatory Visit (HOSPITAL_BASED_OUTPATIENT_CLINIC_OR_DEPARTMENT_OTHER)
Admission: RE | Admit: 2022-04-28 | Discharge: 2022-04-28 | Disposition: A | Discharge status: Home / Self Care | Payer: Medicare Other | Source: Ambulatory Visit | Attending: Surgery | Admitting: Surgery

## 2022-04-28 ENCOUNTER — Other Ambulatory Visit: Payer: Self-pay

## 2022-04-28 ENCOUNTER — Encounter (HOSPITAL_BASED_OUTPATIENT_CLINIC_OR_DEPARTMENT_OTHER): Payer: Self-pay | Admitting: Surgery

## 2022-04-28 ENCOUNTER — Ambulatory Visit (HOSPITAL_BASED_OUTPATIENT_CLINIC_OR_DEPARTMENT_OTHER): Payer: Medicare Other | Admitting: Anesthesiology

## 2022-04-28 ENCOUNTER — Encounter (HOSPITAL_BASED_OUTPATIENT_CLINIC_OR_DEPARTMENT_OTHER)
Admission: RE | Disposition: A | Discharge status: Home / Self Care | Payer: Self-pay | Source: Ambulatory Visit | Attending: Surgery

## 2022-04-28 ENCOUNTER — Ambulatory Visit
Admission: RE | Admit: 2022-04-28 | Discharge: 2022-04-28 | Disposition: A | Discharge status: Home / Self Care | Payer: Medicare Other | Source: Ambulatory Visit | Attending: Surgery | Admitting: Surgery

## 2022-04-28 DIAGNOSIS — I2510 Atherosclerotic heart disease of native coronary artery without angina pectoris: Secondary | ICD-10-CM

## 2022-04-28 DIAGNOSIS — C50911 Malignant neoplasm of unspecified site of right female breast: Secondary | ICD-10-CM | POA: Diagnosis not present

## 2022-04-28 DIAGNOSIS — E669 Obesity, unspecified: Secondary | ICD-10-CM | POA: Diagnosis not present

## 2022-04-28 DIAGNOSIS — Z6834 Body mass index (BMI) 34.0-34.9, adult: Secondary | ICD-10-CM | POA: Insufficient documentation

## 2022-04-28 DIAGNOSIS — Z170 Estrogen receptor positive status [ER+]: Secondary | ICD-10-CM

## 2022-04-28 DIAGNOSIS — Z853 Personal history of malignant neoplasm of breast: Secondary | ICD-10-CM

## 2022-04-28 DIAGNOSIS — K219 Gastro-esophageal reflux disease without esophagitis: Secondary | ICD-10-CM | POA: Insufficient documentation

## 2022-04-28 DIAGNOSIS — N649 Disorder of breast, unspecified: Secondary | ICD-10-CM | POA: Diagnosis not present

## 2022-04-28 DIAGNOSIS — I10 Essential (primary) hypertension: Secondary | ICD-10-CM | POA: Diagnosis not present

## 2022-04-28 DIAGNOSIS — R928 Other abnormal and inconclusive findings on diagnostic imaging of breast: Secondary | ICD-10-CM | POA: Diagnosis not present

## 2022-04-28 DIAGNOSIS — N62 Hypertrophy of breast: Secondary | ICD-10-CM | POA: Diagnosis not present

## 2022-04-28 HISTORY — PX: BREAST LUMPECTOMY WITH RADIOACTIVE SEED LOCALIZATION: SHX6424

## 2022-04-28 HISTORY — DX: Unspecified asthma, uncomplicated: J45.909

## 2022-04-28 SURGERY — BREAST LUMPECTOMY WITH RADIOACTIVE SEED LOCALIZATION
Anesthesia: General | Site: Breast | Laterality: Right

## 2022-04-28 MED ORDER — LACTATED RINGERS IV SOLN: INTRAVENOUS | Status: DC

## 2022-04-28 MED ORDER — LIDOCAINE HCL (CARDIAC) PF 100 MG/5ML IV SOSY
PREFILLED_SYRINGE | INTRAVENOUS | Status: DC | PRN
  Administered 2022-04-28: 80 mg via INTRATRACHEAL

## 2022-04-28 MED ORDER — DEXAMETHASONE SODIUM PHOSPHATE 10 MG/ML IJ SOLN
INTRAMUSCULAR | Status: DC | PRN
  Administered 2022-04-28: 10 mg via INTRAVENOUS

## 2022-04-28 MED ORDER — GLYCOPYRROLATE 0.2 MG/ML IJ SOLN
INTRAMUSCULAR | Status: DC | PRN
  Administered 2022-04-28: .2 mg via INTRAVENOUS

## 2022-04-28 MED ORDER — ACETAMINOPHEN 325 MG PO TABS: 325.0000 mg | ORAL_TABLET | ORAL | Status: DC | PRN

## 2022-04-28 MED ORDER — CHLORHEXIDINE GLUCONATE CLOTH 2 % EX PADS: 6.0000 | MEDICATED_PAD | Freq: Once | CUTANEOUS | Status: DC

## 2022-04-28 MED ORDER — FENTANYL CITRATE (PF) 100 MCG/2ML IJ SOLN
INTRAMUSCULAR | Status: AC
  Filled 2022-04-28: qty 2

## 2022-04-28 MED ORDER — PROPOFOL 10 MG/ML IV BOLUS
INTRAVENOUS | Status: AC
  Filled 2022-04-28: qty 20

## 2022-04-28 MED ORDER — CIPROFLOXACIN IN D5W 400 MG/200ML IV SOLN
INTRAVENOUS | Status: AC
Start: 2022-04-28 — End: ?
  Filled 2022-04-28: qty 200

## 2022-04-28 MED ORDER — OXYCODONE HCL 5 MG PO TABS: 5.0000 mg | ORAL_TABLET | Freq: Once | ORAL | Status: DC | PRN

## 2022-04-28 MED ORDER — TRAMADOL HCL 50 MG PO TABS: 50.0000 mg | ORAL_TABLET | Freq: Four times a day (QID) | ORAL | 0 refills | Status: DC | PRN

## 2022-04-28 MED ORDER — CIPROFLOXACIN IN D5W 400 MG/200ML IV SOLN
400.0000 mg | INTRAVENOUS | Status: AC
  Administered 2022-04-28: 400 mg via INTRAVENOUS

## 2022-04-28 MED ORDER — DEXAMETHASONE SODIUM PHOSPHATE 10 MG/ML IJ SOLN
INTRAMUSCULAR | Status: AC
  Filled 2022-04-28: qty 1

## 2022-04-28 MED ORDER — PROPOFOL 10 MG/ML IV BOLUS
INTRAVENOUS | Status: DC | PRN
  Administered 2022-04-28: 120 mg via INTRAVENOUS

## 2022-04-28 MED ORDER — EPHEDRINE SULFATE (PRESSORS) 50 MG/ML IJ SOLN
INTRAMUSCULAR | Status: DC | PRN
  Administered 2022-04-28 (×2): 10 mg via INTRAVENOUS

## 2022-04-28 MED ORDER — BUPIVACAINE-EPINEPHRINE 0.5% -1:200000 IJ SOLN
INTRAMUSCULAR | Status: DC | PRN
  Administered 2022-04-28: 15 mL

## 2022-04-28 MED ORDER — ACETAMINOPHEN 160 MG/5ML PO SOLN: 325.0000 mg | ORAL | Status: DC | PRN

## 2022-04-28 MED ORDER — ONDANSETRON HCL 4 MG/2ML IJ SOLN: 4.0000 mg | Freq: Once | INTRAMUSCULAR | Status: DC | PRN

## 2022-04-28 MED ORDER — ONDANSETRON HCL 4 MG/2ML IJ SOLN
INTRAMUSCULAR | Status: AC
  Filled 2022-04-28: qty 2

## 2022-04-28 MED ORDER — ACETAMINOPHEN 500 MG PO TABS
ORAL_TABLET | ORAL | Status: AC
  Filled 2022-04-28: qty 2

## 2022-04-28 MED ORDER — FENTANYL CITRATE (PF) 100 MCG/2ML IJ SOLN
INTRAMUSCULAR | Status: DC | PRN
  Administered 2022-04-28: 50 ug via INTRAVENOUS

## 2022-04-28 MED ORDER — OXYCODONE HCL 5 MG/5ML PO SOLN: 5.0000 mg | Freq: Once | ORAL | Status: DC | PRN

## 2022-04-28 MED ORDER — ACETAMINOPHEN 500 MG PO TABS
1000.0000 mg | ORAL_TABLET | ORAL | Status: AC
  Administered 2022-04-28: 1000 mg via ORAL

## 2022-04-28 MED ORDER — ONDANSETRON HCL 4 MG/2ML IJ SOLN
INTRAMUSCULAR | Status: DC | PRN
  Administered 2022-04-28: 4 mg via INTRAVENOUS

## 2022-04-28 MED ORDER — FENTANYL CITRATE (PF) 100 MCG/2ML IJ SOLN
25.0000 ug | INTRAMUSCULAR | Status: DC | PRN
  Administered 2022-04-28: 50 ug via INTRAVENOUS

## 2022-04-28 MED ORDER — LIDOCAINE 2% (20 MG/ML) 5 ML SYRINGE
INTRAMUSCULAR | Status: AC
Start: 2022-04-28 — End: ?
  Filled 2022-04-28: qty 5

## 2022-04-28 SURGICAL SUPPLY — 53 items
ADH SKN CLS APL DERMABOND .7 (GAUZE/BANDAGES/DRESSINGS) ×1
APL PRP STRL LF DISP 70% ISPRP (MISCELLANEOUS) ×1
APPLIER CLIP 9.375 MED OPEN (MISCELLANEOUS)
APR CLP MED 9.3 20 MLT OPN (MISCELLANEOUS)
BINDER BREAST 3XL (GAUZE/BANDAGES/DRESSINGS) IMPLANT
BINDER BREAST LRG (GAUZE/BANDAGES/DRESSINGS) IMPLANT
BINDER BREAST MEDIUM (GAUZE/BANDAGES/DRESSINGS) IMPLANT
BINDER BREAST XLRG (GAUZE/BANDAGES/DRESSINGS) IMPLANT
BINDER BREAST XXLRG (GAUZE/BANDAGES/DRESSINGS) IMPLANT
BLADE SURG 15 STRL LF DISP TIS (BLADE) ×1 IMPLANT
BLADE SURG 15 STRL SS (BLADE) ×2
CANISTER SUC SOCK COL 7IN (MISCELLANEOUS) IMPLANT
CANISTER SUCT 1200ML W/VALVE (MISCELLANEOUS) IMPLANT
CHLORAPREP W/TINT 26 (MISCELLANEOUS) ×2 IMPLANT
CLIP APPLIE 9.375 MED OPEN (MISCELLANEOUS) IMPLANT
COVER BACK TABLE 60X90IN (DRAPES) ×2 IMPLANT
COVER MAYO STAND STRL (DRAPES) ×2 IMPLANT
COVER PROBE W GEL 5X96 (DRAPES) ×2 IMPLANT
DERMABOND ADVANCED (GAUZE/BANDAGES/DRESSINGS) ×1
DERMABOND ADVANCED .7 DNX12 (GAUZE/BANDAGES/DRESSINGS) ×1 IMPLANT
DRAPE LAPAROSCOPIC ABDOMINAL (DRAPES) ×2 IMPLANT
DRAPE UTILITY XL STRL (DRAPES) ×2 IMPLANT
ELECT REM PT RETURN 9FT ADLT (ELECTROSURGICAL) ×2
ELECTRODE REM PT RTRN 9FT ADLT (ELECTROSURGICAL) ×1 IMPLANT
GAUZE SPONGE 4X4 12PLY STRL LF (GAUZE/BANDAGES/DRESSINGS) IMPLANT
GLOVE BIOGEL PI IND STRL 6.5 (GLOVE) IMPLANT
GLOVE BIOGEL PI IND STRL 7.0 (GLOVE) IMPLANT
GLOVE BIOGEL PI INDICATOR 6.5 (GLOVE) ×1
GLOVE BIOGEL PI INDICATOR 7.0 (GLOVE) ×1
GLOVE ECLIPSE 6.5 STRL STRAW (GLOVE) ×1 IMPLANT
GLOVE SURG SIGNA 7.5 PF LTX (GLOVE) ×2 IMPLANT
GOWN STRL REUS W/ TWL LRG LVL3 (GOWN DISPOSABLE) ×1 IMPLANT
GOWN STRL REUS W/ TWL XL LVL3 (GOWN DISPOSABLE) ×1 IMPLANT
GOWN STRL REUS W/TWL LRG LVL3 (GOWN DISPOSABLE) ×2
GOWN STRL REUS W/TWL XL LVL3 (GOWN DISPOSABLE) ×4
KIT MARKER MARGIN INK (KITS) ×2 IMPLANT
NDL HYPO 25X1 1.5 SAFETY (NEEDLE) ×1 IMPLANT
NEEDLE HYPO 25X1 1.5 SAFETY (NEEDLE) ×2 IMPLANT
NS IRRIG 1000ML POUR BTL (IV SOLUTION) ×1 IMPLANT
PACK BASIN DAY SURGERY FS (CUSTOM PROCEDURE TRAY) ×2 IMPLANT
PENCIL SMOKE EVACUATOR (MISCELLANEOUS) ×2 IMPLANT
SLEEVE SCD COMPRESS KNEE MED (STOCKING) ×2 IMPLANT
SPIKE FLUID TRANSFER (MISCELLANEOUS) IMPLANT
SPONGE T-LAP 4X18 ~~LOC~~+RFID (SPONGE) ×2 IMPLANT
SUT MNCRL AB 4-0 PS2 18 (SUTURE) ×2 IMPLANT
SUT SILK 2 0 SH (SUTURE) IMPLANT
SUT VIC AB 3-0 SH 27 (SUTURE) ×2
SUT VIC AB 3-0 SH 27X BRD (SUTURE) ×1 IMPLANT
SYR CONTROL 10ML LL (SYRINGE) ×2 IMPLANT
TOWEL GREEN STERILE FF (TOWEL DISPOSABLE) ×2 IMPLANT
TRAY FAXITRON CT DISP (TRAY / TRAY PROCEDURE) ×2 IMPLANT
TUBE CONNECTING 20X1/4 (TUBING) IMPLANT
YANKAUER SUCT BULB TIP NO VENT (SUCTIONS) IMPLANT

## 2022-04-28 NOTE — Interval H&P Note (Signed)
History and Physical Interval Note: no change in H and P ? ?04/28/2022 ?8:09 AM ? ?Rachel Barrett  has presented today for surgery, with the diagnosis of RIGHT BREAST CANCER.  The various methods of treatment have been discussed with the patient and family. After consideration of risks, benefits and other options for treatment, the patient has consented to  Procedure(s) with comments: ?RIGHT BREAST LUMPECTOMY WITH RADIOACTIVE SEED LOCALIZATION (Right) - LMA as a surgical intervention.  The patient's history has been reviewed, patient examined, no change in status, stable for surgery.  I have reviewed the patient's chart and labs.  Questions were answered to the patient's satisfaction.   ? ? ?Coralie Keens ? ? ?

## 2022-04-28 NOTE — Anesthesia Postprocedure Evaluation (Signed)
Anesthesia Post Note ? ?Patient: [name] ? ?Procedure(s) Performed: RIGHT BREAST LUMPECTOMY WITH RADIOACTIVE SEED LOCALIZATION (Right: Breast) ? ?  ? ?Patient location during evaluation: Phase II ?Anesthesia Type: General ?Level of consciousness: awake ?Pain management: pain level controlled ?Vital Signs Assessment: post-procedure vital signs reviewed and stable ?Respiratory status: spontaneous breathing ?Cardiovascular status: stable ?Postop Assessment: no apparent nausea or vomiting ?Anesthetic complications: no ? ? ?No notable events documented. ? ?Last Vitals:  ?Vitals:  ? 04/28/22 1115 04/28/22 1130  ?BP: 121/74 127/71  ?Pulse: 63 (!) 55  ?Resp: 12 11  ?Temp:    ?SpO2: 96% 94%  ?  ?Last Pain:  ?Vitals:  ? 04/28/22 1117  ?TempSrc:   ?PainSc: 7   ? ? ?  ?  ?  ?  ?  ?  ? ?Rachel Barrett ? ? ? ? ?

## 2022-04-28 NOTE — Op Note (Signed)
RIGHT BREAST LUMPECTOMY WITH RADIOACTIVE SEED LOCALIZATION  Procedure Note ? ?Rachel Barrett ?04/28/2022 ? ? ?Pre-op Diagnosis: RIGHT BREAST CANCER ?    ?Post-op Diagnosis: same ? ?Procedure(s): ?RIGHT BREAST LUMPECTOMY WITH RADIOACTIVE SEED LOCALIZATION ? ?Surgeon(s): ?Coralie Keens, MD ?Lucita Ferrara, PA-C ? ?Anesthesia: General ? ?Staff:  ?Circulator: Ted Mcalpine, RN; Izora Ribas, RN ? ?Estimated Blood Loss:  ?              ?Specimens: Sent to pathology ? ?This is a 76 year old female was found to have a small abnormality in the right breast on screening mammography.  A biopsy showed invasive ductal carcinoma which was ER and PR positive.  The decision was made to proceed with a radioactive seed guided lumpectomy ? ?Procedure: The patient was brought to the operating room and identifies correct patient.  She is placed upon the operating table general anesthesia was induced.  Her abdomen was prepped and draped in the usual sterile fashion.  The radioactive seed was located in the 3 o'clock position of the right breast using the neoprobe.  I anesthetized the skin over the area of the seed with Marcaine and then made a longitudinal incision with a scalpel.  I then dissected down to the breast tissue with the cautery.  With the aid of neoprobe I then performed a wide lumpectomy going all the way down the chest wall attempting to stay widely around the radioactive seed with the aid of the neoprobe.  I tied the lumpectomy out the chest wall and completed the excision circumferentially with the cautery.  I marked all margins with paint.  An x-ray was performed on the specimen confirming the radioactive seed and previous biopsy clip were in the specimen.  This was sent to pathology for evaluation.  I anesthetized the incision further with Marcaine.  I then placed surgical clips around the periphery of the lumpectomy cavity for marking purposes.  The deep and subcutaneous tissue was then closed with  interrupted 3-0 Vicryl sutures and the skin was closed with running 4-0 Monocryl.  Dermabond was then applied.  The patient tolerated the procedure well.  All the counts were correct at the end of the procedure.  The patient was then extubated in the operating room and taken in stable condition to the recovery room. ?        ? ?Coralie Keens  ? ?Date: 04/28/2022  Time: 10:33 AM ? ? ? ?

## 2022-04-28 NOTE — Discharge Instructions (Addendum)
[facility] ?Office Phone Number [phone] ? ?BREAST BIOPSY/ PARTIAL MASTECTOMY: POST OP INSTRUCTIONS ? ?Always review your discharge instruction sheet given to you by the facility where your Barrett was performed. ? ?IF YOU HAVE DISABILITY OR FAMILY LEAVE FORMS, YOU MUST BRING THEM TO THE OFFICE FOR PROCESSING.  DO NOT GIVE THEM TO YOUR DOCTOR. ? ?A prescription for pain medication may be given to you upon discharge.  Take your pain medication as prescribed, if needed.  If narcotic pain medicine is not needed, then you may take acetaminophen (Tylenol) or ibuprofen (Advil) as needed. ?Take your usually prescribed medications unless otherwise directed ?If you need a refill on your pain medication, please contact your pharmacy.  They will contact our office to request authorization.  Prescriptions will not be filled after 5pm or on week-ends. ?You should eat very light the first 24 hours after Barrett, such as soup, crackers, pudding, etc.  Resume your normal diet the day after Barrett. ?Most patients will experience some swelling and bruising in the breast.  Ice packs and a good support bra will help.  Swelling and bruising can take several days to resolve.  ?It is common to experience some constipation if taking pain medication after Barrett.  Increasing fluid intake and taking a stool softener will usually help or prevent this problem from occurring.  A mild laxative (Milk of Magnesia or Miralax) should be taken according to package directions if there are no bowel movements after 48 hours. ?Unless discharge instructions indicate otherwise, you may remove your bandages 24-48 hours after Barrett, and you may shower at that time.  You may have steri-strips (small skin tapes) in place directly over the incision.  These strips should be left on the skin for 7-10 days.  If your surgeon used skin glue on the incision, you may shower in 24 hours.  The glue will flake off over the next 2-3 weeks.  Any  sutures or staples will be removed at the office during your follow-up visit. ?ACTIVITIES:  You may resume regular daily activities (gradually increasing) beginning the next day.  Wearing a good support bra or sports bra minimizes pain and swelling.  You may have sexual intercourse when it is comfortable. ?You may drive when you no longer are taking prescription pain medication, you can comfortably wear a seatbelt, and you can safely maneuver your car and apply brakes. ?RETURN TO WORK:  ______________________________________________________________________________________ ?You should see your doctor in the office for a follow-up appointment approximately two weeks after your Barrett.  Your doctor?s nurse will typically make your follow-up appointment when she calls you with your pathology report.  Expect your pathology report 2-3 business days after your Barrett.  You may call to check if you do not hear from [place] after three days. ?OTHER INSTRUCTIONS: OK TO SHOWER STARTING TOMORROW ?ICE PACK, TYLENOL, AND IBUPROFEN ALSO FOR PAIN ?NO VIGOROUS ACTIVITY FOR ONE WEEK ?_______________________________________________________________________________________________ _____________________________________________________________________________________________________________________________________ ?_____________________________________________________________________________________________________________________________________ ?_____________________________________________________________________________________________________________________________________ ? ?WHEN TO CALL YOUR DOCTOR: ?Fever over 101.0 ?Nausea and/or vomiting. ?Extreme swelling or bruising. ?Continued bleeding from incision. ?Increased pain, redness, or drainage from the incision. ? ?The clinic staff is available to answer your questions during regular business hours.  Please don?t hesitate to call and ask to speak to one of the nurses for clinical  concerns.  If you have a medical emergency, go to the nearest emergency room or call 911.  A surgeon from [facility] is always on call at the hospital. ? ?For further questions, please visit  [email or web address]   ? ?No tylenol until after 2:30pm today, if needed. ? ? ?Post Anesthesia Home Care Instructions ? ?Activity: ?Get plenty of rest for the remainder of the day. A responsible individual must stay with you for 24 hours following the procedure.  ?For the next 24 hours, DO NOT: ?-Drive a car ?-Paediatric nurse ?-Drink alcoholic beverages ?-Take any medication unless instructed by your physician ?-Make any legal decisions or sign important papers. ? ?Meals: ?Start with liquid foods such as gelatin or soup. Progress to regular foods as tolerated. Avoid greasy, spicy, heavy foods. If nausea and/or vomiting occur, drink only clear liquids until the nausea and/or vomiting subsides. Call your physician if vomiting continues. ? ?Special Instructions/Symptoms: ?Your throat may feel dry or sore from the anesthesia or the breathing tube placed in your throat during Barrett. If this causes discomfort, gargle with warm salt water. The discomfort should disappear within 24 hours. ? ?If you had a scopolamine patch placed behind your ear for the management of post- operative nausea and/or vomiting: ? ?1. The medication in the patch is effective for 72 hours, after which it should be removed.  Wrap patch in a tissue and discard in the trash. Wash hands thoroughly with soap and water. ?2. You may remove the patch earlier than 72 hours if you experience unpleasant side effects which may include dry mouth, dizziness or visual disturbances. ?3. Avoid touching the patch. Wash your hands with soap and water after contact with the patch. ?    ?

## 2022-04-28 NOTE — Anesthesia Procedure Notes (Signed)
Procedure Name: LMA Insertion ?Date/Time: 04/28/2022 10:13 AM ?Performed by: Glory Buff, CRNA ?Pre-anesthesia Checklist: Patient identified, Emergency Drugs available, Suction available and Patient being monitored ?Patient Re-evaluated:Patient Re-evaluated prior to induction ?Oxygen Delivery Method: Circle system utilized ?Preoxygenation: Pre-oxygenation with 100% oxygen ?Induction Type: IV induction ?LMA: LMA inserted ?LMA Size: 4.0 ?Number of attempts: 1 ?Placement Confirmation: positive ETCO2 ?Tube secured with: Tape ?Dental Injury: Teeth and Oropharynx as per pre-operative assessment  ? ? ? ? ?

## 2022-04-28 NOTE — Anesthesia Preprocedure Evaluation (Addendum)
Anesthesia Evaluation  ?Patient identified by MRN, date of birth, ID band ?Patient awake ? ? ? ?Airway ?Mallampati: I ? ? ? ? ? ? Dental ?no notable dental hx. ? ?  ?Pulmonary ?asthma ,  ?  ?Pulmonary exam normal ? ? ? ? ? ? ? Cardiovascular ?hypertension, Pt. on medications ?Normal cardiovascular exam ? ? ?  ?Neuro/Psych ?negative neurological ROS ?   ? GI/Hepatic ?Neg liver ROS, GERD  Medicated and Controlled,  ?Endo/Other  ? ? Renal/GU ?negative Renal ROS  ?negative genitourinary ?  ?Musculoskeletal ?negative musculoskeletal ROS ?(+)  ? Abdominal ?(+) + obese,   ?Peds ? Hematology ?negative hematology ROS ?(+)   ?Anesthesia Other Findings ? ? Reproductive/Obstetrics ? ?  ? ? ? ? ? ? ? ? ? ? ? ? ? ?  ?  ? ? ? ? ? ? ? ? ?Anesthesia Physical ?Anesthesia Plan ? ?ASA: 2 ? ?Anesthesia Plan: General  ? ?Post-op Pain Management:   ? ?Induction: Intravenous ? ?PONV Risk Score and Plan: 3 and Ondansetron ? ?Airway Management Planned: LMA ? ?Additional Equipment: None ? ?Intra-op Plan:  ? ?Post-operative Plan: Extubation in OR ? ?Informed Consent: I have reviewed the patients History and Physical, chart, labs and discussed the procedure including the risks, benefits and alternatives for the proposed anesthesia with the patient or authorized representative who has indicated his/her understanding and acceptance.  ? ? ? ?Dental advisory given ? ?Plan Discussed with: CRNA ? ?Anesthesia Plan Comments:   ? ? ? ? ? ? ?Anesthesia Quick Evaluation ? ?

## 2022-04-28 NOTE — Transfer of Care (Signed)
Immediate Anesthesia Transfer of Care Note ? ?Patient: [facility] ? ?Procedure(s) Performed: RIGHT BREAST LUMPECTOMY WITH RADIOACTIVE SEED LOCALIZATION (Right: Breast) ? ?Patient Location: PACU ? ?Anesthesia Type:General ? ?Level of Consciousness: drowsy and patient cooperative ? ?Airway & Oxygen Therapy: Patient Spontanous Breathing and Patient connected to face mask oxygen ? ?Post-op Assessment: Report given to RN and Post -op Vital signs reviewed and stable ? ?Post vital signs: Reviewed and stable ? ?Last Vitals:  ?Vitals Value Taken Time  ?BP    ?Temp    ?Pulse    ?Resp    ?SpO2    ? ? ?Last Pain:  ?Vitals:  ? 04/28/22 0816  ?TempSrc: Oral  ?PainSc: 0-No pain  ?   ? ?  ? ?Complications: No notable events documented. ?

## 2022-04-29 ENCOUNTER — Encounter (HOSPITAL_BASED_OUTPATIENT_CLINIC_OR_DEPARTMENT_OTHER): Payer: Self-pay | Admitting: Surgery

## 2022-04-30 ENCOUNTER — Telehealth: Payer: Self-pay | Admitting: Genetic Counselor

## 2022-04-30 LAB — SURGICAL PATHOLOGY

## 2022-04-30 NOTE — Telephone Encounter (Signed)
Revealed that STAT panel was negative. We are still waiting on the remainder of the testing.  We will call once we get the remainder of the testing.

## 2022-05-04 ENCOUNTER — Encounter: Payer: Self-pay | Admitting: *Deleted

## 2022-05-04 DIAGNOSIS — Z170 Estrogen receptor positive status [ER+]: Secondary | ICD-10-CM

## 2022-05-06 ENCOUNTER — Ambulatory Visit: Payer: Self-pay | Admitting: Genetic Counselor

## 2022-05-06 ENCOUNTER — Telehealth: Payer: Self-pay | Admitting: Genetic Counselor

## 2022-05-06 DIAGNOSIS — J4530 Mild persistent asthma, uncomplicated: Secondary | ICD-10-CM | POA: Diagnosis not present

## 2022-05-06 DIAGNOSIS — I10 Essential (primary) hypertension: Secondary | ICD-10-CM | POA: Diagnosis not present

## 2022-05-06 DIAGNOSIS — C50911 Malignant neoplasm of unspecified site of right female breast: Secondary | ICD-10-CM | POA: Diagnosis not present

## 2022-05-06 DIAGNOSIS — Z1379 Encounter for other screening for genetic and chromosomal anomalies: Secondary | ICD-10-CM | POA: Insufficient documentation

## 2022-05-06 DIAGNOSIS — Z170 Estrogen receptor positive status [ER+]: Secondary | ICD-10-CM | POA: Diagnosis not present

## 2022-05-06 DIAGNOSIS — K219 Gastro-esophageal reflux disease without esophagitis: Secondary | ICD-10-CM | POA: Diagnosis not present

## 2022-05-06 NOTE — Progress Notes (Signed)
HPI:  Ms. Wisz was previously seen in the [facility] due to a personal and family history of cancer and concerns regarding a hereditary predisposition to cancer. Please refer to our prior cancer genetics clinic note for more information regarding our discussion, assessment and recommendations, at the time. Ms. Trabucco recent genetic test results were disclosed to her, as were recommendations warranted by these results. These results and recommendations are discussed in more detail below.  CANCER HISTORY:  Oncology History  Malignant neoplasm of upper-inner quadrant of right breast in female, estrogen receptor positive ([place])  03/25/2022 Mammogram   Patient had screening mammogram which showed small architectural distortion in the inner right breast.  Diagnostic mammogram confirmed the architectural distortion and stereotactic biopsy was recommended.   04/15/2022 Initial Diagnosis   Malignant neoplasm of upper-inner quadrant of right breast in female, estrogen receptor positive ([place])   04/19/2022 Cancer Staging   Staging form: Breast, AJCC 8th Edition - Clinical: Stage IA (cT1, cN0, cM0, G1, ER+, PR+, HER2-) - Signed by Benay Pike, MD on 04/19/2022 Histologic grading system: 3 grade system    04/20/2022 Pathology Results   Pathology results showed invasive ductal carcinoma, grade 1, atypical ductal hyperplasia with calcifications, ER 100% positive strong staining intensity, PR 2% positive strong staining intensity, Ki-67 of 1% and tumor cells are negative for HER2 1+   05/05/2022 Genetic Testing   Negative genetic testing on the CancerNext-Expanded+RNAinsight.  The report date is May 245, 2023.  The CancerNext-Expanded gene panel offered by [facility] and includes sequencing and rearrangement analysis for the following 77 genes: AIP, ALK, APC*, ATM*, AXIN2, BAP1, BARD1, BLM, BMPR1A, BRCA1*, BRCA2*, BRIP1*, CDC73, CDH1*, CDK4, CDKN1B, CDKN2A, CHEK2*, CTNNA1, DICER1, FANCC,  FH, FLCN, GALNT12, KIF1B, LZTR1, MAX, MEN1, MET, MLH1*, MSH2*, MSH3, MSH6*, MUTYH*, NBN, NF1*, NF2, NTHL1, PALB2*, PHOX2B, PMS2*, POT1, PRKAR1A, PTCH1, PTEN*, RAD51C*, RAD51D*, RB1, RECQL, RET, SDHA, SDHAF2, SDHB, SDHC, SDHD, SMAD4, SMARCA4, SMARCB1, SMARCE1, STK11, SUFU, TMEM127, TP53*, TSC1, TSC2, VHL and XRCC2 (sequencing and deletion/duplication); EGFR, EGLN1, HOXB13, KIT, MITF, PDGFRA, POLD1, and POLE (sequencing only); EPCAM and GREM1 (deletion/duplication only). DNA and RNA analyses performed for * genes.      FAMILY HISTORY:  We obtained a detailed, 4-generation family history.  Significant diagnoses are listed below: Family History  Problem Relation Age of Onset   Heart attack Mother        at age of 67 died in her sleep "massive heart attack"   Goiter Mother        benign   Hypertension Father    Heart disease Father    Hypertension Sister    Breast cancer Sister 22   Hypertension Brother    Lung cancer Brother    Cancer Brother        Peritoneal cancer   Heart disease Maternal Grandmother    Heart disease Maternal Grandfather    Heart disease Paternal Grandmother    Heart disease Paternal Grandfather    Breast cancer Maternal Aunt    Prostate cancer Maternal Uncle        4 maternal uncles   Lung cancer Maternal Uncle    Stomach cancer Paternal Aunt    Liver cancer Paternal Aunt    Kidney cancer Paternal Uncle    Stomach cancer Cousin        maternal cousin   Stomach cancer Other        MGMs sister   Diabetes Neg Hx       The  patient has one son who is cancer free.  The patient has three sisters and three brothers. One sister had breast cancer at 34, one brother had NSCLC and a second brother had peritoneal cancer.  The parents are both deceased.   The patient's father died of heart disease.  He had seven sisters and four brothers.  One sister had liver cancer, another had stomach cancer and one brother had kidney cancer.  The paternal grandparents died of non  cancer related issues.   The patient's mother died of a heart attack.  She had two sisters and six brothers.  Four brothers had prostate cancer, one sister had breast cancer and on brother had lung cancer.  The maternal grandparents are deceased from non-cancer related issues.  The MGM's sister had stomach cancer.   Ms. Stenglein is unaware of previous family history of genetic testing for hereditary cancer risks. Patient's maternal ancestors are of African American descent, and paternal ancestors are of African American descent. There is no reported Ashkenazi Jewish ancestry. There is no known consanguinity.  GENETIC TEST RESULTS: Genetic testing reported out on May 05, 2022 through the CancerNext-Expanded+RNAinsight cancer panel found no pathogenic mutations. Negative genetic testing on the CancerNext-Expanded+RNAinsight.  The report date is May 245, 2023. The test report has been scanned into EPIC and is located under the Molecular Pathology section of the Results Review tab.  A portion of the result report is included below for reference.     We discussed with Ms. Flicker that because current genetic testing is not perfect, it is possible there may be a gene mutation in one of these genes that current testing cannot detect, but that chance is small.  We also discussed, that there could be another gene that has not yet been discovered, or that we have not yet tested, that is responsible for the cancer diagnoses in the family. It is also possible there is a hereditary cause for the cancer in the family that Ms. Kosch did not inherit and therefore was not identified in her testing.  Therefore, it is important to remain in touch with cancer genetics in the future so that we can continue to offer Ms. Enis the most up to date genetic testing.   ADDITIONAL GENETIC TESTING: We discussed with Ms. Hotard that her genetic testing was fairly extensive.  If there are genes identified to increase cancer risk that can  be analyzed in the future, we would be happy to discuss and coordinate this testing at that time.    CANCER SCREENING RECOMMENDATIONS: Ms. Gray test result is considered negative (normal).  This means that we have not identified a hereditary cause for her personal and family history of cancer at this time. Most cancers happen by chance and this negative test suggests that her cancer may fall into this category.    While reassuring, this does not definitively rule out a hereditary predisposition to cancer. It is still possible that there could be genetic mutations that are undetectable by current technology. There could be genetic mutations in genes that have not been tested or identified to increase cancer risk.  Therefore, it is recommended she continue to follow the cancer management and screening guidelines provided by her oncology and primary healthcare provider.   An individual's cancer risk and medical management are not determined by genetic test results alone. Overall cancer risk assessment incorporates additional factors, including personal medical history, family history, and any available genetic information that may result in a personalized  plan for cancer prevention and surveillance  RECOMMENDATIONS FOR FAMILY MEMBERS:  Individuals in this family might be at some increased risk of developing cancer, over the general population risk, simply due to the family history of cancer.  We recommended women in this family have a yearly mammogram beginning at age 21, or 90 years younger than the earliest onset of cancer, an annual clinical breast exam, and perform monthly breast self-exams. Women in this family should also have a gynecological exam as recommended by their primary provider. All family members should be referred for colonoscopy starting at age 59.  FOLLOW-UP: Lastly, we discussed with Ms. Claybrook that cancer genetics is a rapidly advancing field and it is possible that new genetic tests  will be appropriate for her and/or her family members in the future. We encouraged her to remain in contact with cancer genetics on an annual basis so we can update her personal and family histories and let her know of advances in cancer genetics that may benefit this family.   Our contact number was provided. Ms. Amato questions were answered to her satisfaction, and she knows she is welcome to call us at anytime with additional questions or concerns.   Roma Kayser, [place], [facility] Licensed, Certified Genetic Counselor [email or web address]

## 2022-05-06 NOTE — Telephone Encounter (Signed)
No answer on phone.

## 2022-05-06 NOTE — Telephone Encounter (Signed)
Revealed negative genetic testing.  Discussed that we do not know why she has breast cancer or why there is cancer in the family. It could be due to a different gene that we are not testing, or maybe our current technology may not be able to pick something up.  It will be important for her to keep in contact with genetics to keep up with whether additional testing may be needed. 

## 2022-05-13 ENCOUNTER — Encounter: Payer: Self-pay | Admitting: *Deleted

## 2022-05-13 DIAGNOSIS — H5203 Hypermetropia, bilateral: Secondary | ICD-10-CM | POA: Diagnosis not present

## 2022-05-13 DIAGNOSIS — H2513 Age-related nuclear cataract, bilateral: Secondary | ICD-10-CM | POA: Diagnosis not present

## 2022-05-13 DIAGNOSIS — H401132 Primary open-angle glaucoma, bilateral, moderate stage: Secondary | ICD-10-CM | POA: Diagnosis not present

## 2022-05-18 ENCOUNTER — Encounter: Payer: Self-pay | Admitting: Hematology and Oncology

## 2022-05-18 ENCOUNTER — Inpatient Hospital Stay: Payer: Medicare Other | Attending: Hematology and Oncology | Admitting: Hematology and Oncology

## 2022-05-18 ENCOUNTER — Other Ambulatory Visit: Payer: Self-pay

## 2022-05-18 DIAGNOSIS — Z79899 Other long term (current) drug therapy: Secondary | ICD-10-CM | POA: Diagnosis not present

## 2022-05-18 DIAGNOSIS — C50211 Malignant neoplasm of upper-inner quadrant of right female breast: Secondary | ICD-10-CM | POA: Diagnosis not present

## 2022-05-18 DIAGNOSIS — M8580 Other specified disorders of bone density and structure, unspecified site: Secondary | ICD-10-CM | POA: Diagnosis not present

## 2022-05-18 DIAGNOSIS — Z170 Estrogen receptor positive status [ER+]: Secondary | ICD-10-CM | POA: Diagnosis not present

## 2022-05-18 NOTE — Assessment & Plan Note (Addendum)
This is a very pleasant 76 year old female patient with newly diagnosed right breast invasive ductal carcinoma, grade 1, ER +100% strong staining, PR 2% positive strong staining, Ki-67 of 1% and HER2 negative who is scheduled for right partial mastectomy on Apr 28, 2022 by Dr. Ninfa Linden here for an initial visit with medical oncology with her son.  She had surgery, no residual malignancy.  She doesn't want to move forward with oncotype, she doesn't want to consider chemo even if chemotherapy is recommended. She will proceed with Dr Pearlie Oyster appointment and come back to follow up after radiation to discuss anti estrogen therapy. We have once again discussed about antiestrogen therapy today, mechanism of action, adverse effects including but not limited to postmenopausal symptoms, vaginal dryness, arthralgias/myalgias and bone loss with aromatase inhibitors.  Return to clinic end of July

## 2022-05-18 NOTE — Progress Notes (Signed)
[facility] NOTE  Patient Care Team: Lavone Orn, MD as PCP - General (Internal Medicine) Mauro Kaufmann, RN as Oncology Nurse Navigator Rockwell Germany, RN as Oncology Nurse Navigator Eppie Gibson, MD as Attending Physician (Radiation Oncology) Benay Pike, MD as Consulting Physician (Hematology and Oncology) Coralie Keens, MD as Consulting Physician (General Surgery)  CHIEF COMPLAINTS/PURPOSE OF CONSULTATION:  Right breast cancer follow-up  HISTORY OF PRESENTING ILLNESS:  Rachel Barrett 76 y.o. female is here because of recent diagnosis of right breast invasive ductal carcinoma  I reviewed her records extensively and collaborated the history with the patient.  SUMMARY OF ONCOLOGIC HISTORY: Oncology History  Malignant neoplasm of upper-inner quadrant of right breast in female, estrogen receptor positive ([place])  03/25/2022 Mammogram   Patient had screening mammogram which showed small architectural distortion in the inner right breast.  Diagnostic mammogram confirmed the architectural distortion and stereotactic biopsy was recommended.   04/15/2022 Initial Diagnosis   Malignant neoplasm of upper-inner quadrant of right breast in female, estrogen receptor positive ([place])   04/19/2022 Cancer Staging   Staging form: Breast, AJCC 8th Edition - Clinical: Stage IA (cT1, cN0, cM0, G1, ER+, PR+, HER2-) - Signed by Benay Pike, MD on 04/19/2022 Histologic grading system: 3 grade system    04/20/2022 Pathology Results   Pathology results showed invasive ductal carcinoma, grade 1, atypical ductal hyperplasia with calcifications, ER 100% positive strong staining intensity, PR 2% positive strong staining intensity, Ki-67 of 1% and tumor cells are negative for HER2 1+   04/28/2022 Surgery   She had right breast lumpectomy.  No residual malignancy identified, negative for invasive as well as in situ carcinoma.  Prior biopsy site present.  Fibroadenomatoid mastopathy  noted.  Previous excision showed 11 mm invasive ductal carcinoma, grade 1 with ER +100% strong staining intensity, PR 2% positive strong staining intensity, Ki-67 of 1% and negative for HER2 1+.   05/05/2022 Genetic Testing   Negative genetic testing on the CancerNext-Expanded+RNAinsight.  The report date is May 245, 2023.  The CancerNext-Expanded gene panel offered by [facility] and includes sequencing and rearrangement analysis for the following 77 genes: AIP, ALK, APC*, ATM*, AXIN2, BAP1, BARD1, BLM, BMPR1A, BRCA1*, BRCA2*, BRIP1*, CDC73, CDH1*, CDK4, CDKN1B, CDKN2A, CHEK2*, CTNNA1, DICER1, FANCC, FH, FLCN, GALNT12, KIF1B, LZTR1, MAX, MEN1, MET, MLH1*, MSH2*, MSH3, MSH6*, MUTYH*, NBN, NF1*, NF2, NTHL1, PALB2*, PHOX2B, PMS2*, POT1, PRKAR1A, PTCH1, PTEN*, RAD51C*, RAD51D*, RB1, RECQL, RET, SDHA, SDHAF2, SDHB, SDHC, SDHD, SMAD4, SMARCA4, SMARCB1, SMARCE1, STK11, SUFU, TMEM127, TP53*, TSC1, TSC2, VHL and XRCC2 (sequencing and deletion/duplication); EGFR, EGLN1, HOXB13, KIT, MITF, PDGFRA, POLD1, and POLE (sequencing only); EPCAM and GREM1 (deletion/duplication only). DNA and RNA analyses performed for * genes.     Interval history  She is here for follow up after surgery. She has done well with surgery. Rest of the pertinent 10 point ROS reviewed and surgery  MEDICAL HISTORY:  Past Medical History:  Diagnosis Date   ALLERGIC RHINITIS 02/17/2010   Allergy    ASTHMA 02/17/2010   Asthma    Family history of breast cancer    Family history of kidney cancer    Family history of prostate cancer    Family history of stomach cancer    GERD 02/17/2010   Glaucoma    GOITER, MULTINODULAR 02/17/2010   HYPERGLYCEMIA 02/22/2011   HYPERTENSION 02/17/2010   IBS 02/17/2010   Osteopenia 11/2018   T score -1.9 FRAX 7.9% / 1.4%   Retina hole  Unspecified disorder of liver 02/22/2011    SURGICAL HISTORY: Past Surgical History:  Procedure Laterality Date   ABDOMINAL HYSTERECTOMY  1989    Leiomyoma   Bladder Bx  2009   BREAST EXCISIONAL BIOPSY Left 1999   BREAST LUMPECTOMY WITH RADIOACTIVE SEED LOCALIZATION Right 04/28/2022   Procedure: RIGHT BREAST LUMPECTOMY WITH RADIOACTIVE SEED LOCALIZATION;  Surgeon: Coralie Keens, MD;  Location: [place];  Service: General;  Laterality: Right;  LMA   BREAST SURGERY     Breast cyst   CHOLECYSTECTOMY     EYE SURGERY     EYE SURGERY     Hole in Retina     NOSE SURGERY      SOCIAL HISTORY: Social History   Socioeconomic History   Marital status: Married    Spouse name: Not on file   Number of children: Not on file   Years of education: Not on file   Highest education level: Not on file  Occupational History   Occupation: Administration    Employer: [employer]  Tobacco Use   Smoking status: Never   Smokeless tobacco: Never  Vaping Use   Vaping Use: Never used  Substance and Sexual Activity   Alcohol use: Never   Drug use: No   Sexual activity: Not Currently    Birth control/protection: Surgical    Comment: 1st intercourse 76 yo-Fewer than 5 partners  Other Topics Concern   Not on file  Social History Narrative   Not on file   Social Determinants of Health   Financial Resource Strain: Not on file  Food Insecurity: No Food Insecurity   Worried About [place] in the Last Year: Never true   [place] in the Last Year: Never true  Transportation Needs: No Transportation Needs   Lack of Transportation (Medical): No   Lack of Transportation (Non-Medical): No  Physical Activity: Not on file  Stress: Not on file  Social Connections: Not on file  Intimate Partner Violence: Not on file    FAMILY HISTORY: Family History  Problem Relation Age of Onset   Heart attack Mother        at age of 68 died in her sleep "massive heart attack"   Goiter Mother        benign   Hypertension Father    Heart disease Father    Hypertension Sister    Breast cancer Sister 31   Hypertension  Brother    Lung cancer Brother    Cancer Brother        Peritoneal cancer   Heart disease Maternal Grandmother    Heart disease Maternal Grandfather    Heart disease Paternal Grandmother    Heart disease Paternal Grandfather    Breast cancer Maternal Aunt    Prostate cancer Maternal Uncle        4 maternal uncles   Lung cancer Maternal Uncle    Stomach cancer Paternal Aunt    Liver cancer Paternal Aunt    Kidney cancer Paternal Uncle    Stomach cancer Cousin        maternal cousin   Stomach cancer Other        MGMs sister   Diabetes Neg Hx     ALLERGIES:  is allergic to other, augmentin [amoxicillin-pot clavulanate], clarithromycin, diltiazem hcl, terazosin, and bactrim [sulfamethoxazole-trimethoprim].  MEDICATIONS:  Current Outpatient Medications  Medication Sig Dispense Refill   albuterol (VENTOLIN HFA) 108 (90 Base) MCG/ACT inhaler Inhale 2 puffs into  the lungs as needed.       amLODipine (NORVASC) 5 MG tablet Take 5 mg by mouth daily.       budesonide-formoterol (SYMBICORT) 160-4.5 MCG/ACT inhaler Inhale 2 puffs into the lungs 2 (two) times daily.     fluticasone (FLONASE) 50 MCG/ACT nasal spray Place 2 sprays into the nose as needed.       latanoprost (XALATAN) 0.005 % ophthalmic solution 1 drop at bedtime.     omeprazole (PRILOSEC) 20 MG capsule Take 20 mg by mouth daily as needed.     potassium chloride SA (K-DUR,KLOR-CON) 20 MEQ tablet Take 20 mEq by mouth daily.       timolol (TIMOPTIC) 0.5 % ophthalmic solution      traMADol (ULTRAM) 50 MG tablet Take 1 tablet (50 mg total) by mouth every 6 (six) hours as needed for moderate pain or severe pain. 20 tablet 0   valsartan-hydrochlorothiazide (DIOVAN-HCT) 160-25 MG per tablet Take 1 tablet by mouth daily.       No current facility-administered medications for this visit.    REVIEW OF SYSTEMS:   Constitutional: Denies fevers, chills or abnormal night sweats Eyes: Denies blurriness of vision, double vision or watery  eyes Ears, nose, mouth, throat, and face: Denies mucositis or sore throat Respiratory: Denies cough, dyspnea or wheezes Cardiovascular: Denies palpitation, chest discomfort or lower extremity swelling Gastrointestinal:  Denies nausea, heartburn or change in bowel habits Skin: Denies abnormal skin rashes Lymphatics: Denies new lymphadenopathy or easy bruising Neurological:Denies numbness, tingling or new weaknesses Behavioral/Psych: Mood is stable, no new changes  Breast: Denies any palpable lumps or discharge All other systems were reviewed with the patient and are negative.  PHYSICAL EXAMINATION: ECOG PERFORMANCE STATUS: 0 - Asymptomatic  Vitals:   05/18/22 0944  BP: 136/76  Pulse: 62  Resp: 16  Temp: 97.8 F (36.6 C)  SpO2: 96%    Filed Weights   05/18/22 0944  Weight: 176 lb 9.6 oz (80.1 kg)     GENERAL:alert, no distress and comfortable Surgical scar healing well  LABORATORY DATA:  I have reviewed the data as listed Lab Results  Component Value Date   WBC 7.3 04/19/2022   HGB 13.2 04/19/2022   HCT 39.0 04/19/2022   MCV 87.8 04/19/2022   PLT 243 04/19/2022   Lab Results  Component Value Date   NA 140 04/19/2022   K 3.5 04/19/2022   CL 105 04/19/2022   CO2 29 04/19/2022    RADIOGRAPHIC STUDIES: I have personally reviewed the radiological reports and agreed with the findings in the report.  ASSESSMENT AND PLAN:  Malignant neoplasm of upper-inner quadrant of right breast in female, estrogen receptor positive ([place]) This is a very pleasant 76 year old female patient with newly diagnosed right breast invasive ductal carcinoma, grade 1, ER +100% strong staining, PR 2% positive strong staining, Ki-67 of 1% and HER2 negative who is scheduled for right partial mastectomy on Apr 28, 2022 by Dr. Ninfa Linden here for an initial visit with medical oncology with her son.  She had surgery, no residual malignancy.  She doesn't want to move forward with oncotype, she doesn't  want to consider chemo even if chemotherapy is recommended. She will proceed with Dr Pearlie Oyster appointment and come back to follow up after radiation to discuss anti estrogen therapy. We have once again discussed about antiestrogen therapy today, mechanism of action, adverse effects including but not limited to postmenopausal symptoms, vaginal dryness, arthralgias/myalgias and bone loss with aromatase inhibitors.  Return to  clinic end of July   Total time spent: 30 minutes  All questions were answered. The patient knows to call the clinic with any problems, questions or concerns.    Benay Pike, MD 05/18/22

## 2022-05-20 ENCOUNTER — Encounter (HOSPITAL_COMMUNITY): Payer: Self-pay

## 2022-06-07 DIAGNOSIS — C50211 Malignant neoplasm of upper-inner quadrant of right female breast: Secondary | ICD-10-CM | POA: Diagnosis not present

## 2022-06-07 DIAGNOSIS — Z170 Estrogen receptor positive status [ER+]: Secondary | ICD-10-CM | POA: Diagnosis not present

## 2022-06-08 ENCOUNTER — Ambulatory Visit
Admission: RE | Admit: 2022-06-08 | Discharge: 2022-06-08 | Disposition: A | Discharge status: Home / Self Care | Payer: Medicare Other | Source: Ambulatory Visit | Attending: Radiation Oncology | Admitting: Radiation Oncology

## 2022-06-08 ENCOUNTER — Other Ambulatory Visit: Payer: Self-pay

## 2022-06-08 ENCOUNTER — Encounter: Payer: Self-pay | Admitting: Radiation Oncology

## 2022-06-08 VITALS — BP 135/75 | HR 65 | Temp 98.0°F | Resp 16 | Wt 177.8 lb

## 2022-06-08 DIAGNOSIS — Z170 Estrogen receptor positive status [ER+]: Secondary | ICD-10-CM

## 2022-06-08 DIAGNOSIS — Z7951 Long term (current) use of inhaled steroids: Secondary | ICD-10-CM | POA: Insufficient documentation

## 2022-06-08 DIAGNOSIS — Z79899 Other long term (current) drug therapy: Secondary | ICD-10-CM | POA: Insufficient documentation

## 2022-06-08 DIAGNOSIS — C50211 Malignant neoplasm of upper-inner quadrant of right female breast: Secondary | ICD-10-CM | POA: Insufficient documentation

## 2022-06-14 ENCOUNTER — Ambulatory Visit
Admission: RE | Admit: 2022-06-14 | Discharge: 2022-06-14 | Disposition: A | Discharge status: Home / Self Care | Payer: Medicare Other | Source: Ambulatory Visit | Attending: Radiation Oncology | Admitting: Radiation Oncology

## 2022-06-14 ENCOUNTER — Other Ambulatory Visit: Payer: Self-pay

## 2022-06-14 DIAGNOSIS — C50211 Malignant neoplasm of upper-inner quadrant of right female breast: Secondary | ICD-10-CM | POA: Insufficient documentation

## 2022-06-16 ENCOUNTER — Encounter: Payer: Self-pay | Admitting: *Deleted

## 2022-06-18 DIAGNOSIS — Z170 Estrogen receptor positive status [ER+]: Secondary | ICD-10-CM | POA: Diagnosis not present

## 2022-06-18 DIAGNOSIS — C50211 Malignant neoplasm of upper-inner quadrant of right female breast: Secondary | ICD-10-CM | POA: Diagnosis not present

## 2022-06-22 ENCOUNTER — Other Ambulatory Visit: Payer: Self-pay

## 2022-06-22 ENCOUNTER — Ambulatory Visit
Admission: RE | Admit: 2022-06-22 | Discharge: 2022-06-22 | Disposition: A | Discharge status: Home / Self Care | Payer: Medicare Other | Source: Ambulatory Visit | Attending: Radiation Oncology | Admitting: Radiation Oncology

## 2022-06-22 DIAGNOSIS — Z510 Encounter for antineoplastic radiation therapy: Secondary | ICD-10-CM | POA: Diagnosis not present

## 2022-06-22 DIAGNOSIS — C50211 Malignant neoplasm of upper-inner quadrant of right female breast: Secondary | ICD-10-CM | POA: Diagnosis not present

## 2022-06-22 DIAGNOSIS — Z170 Estrogen receptor positive status [ER+]: Secondary | ICD-10-CM | POA: Diagnosis not present

## 2022-06-22 LAB — RAD ONC ARIA SESSION SUMMARY
Course Elapsed Days: 0
Plan Fractions Treated to Date: 1
Plan Prescribed Dose Per Fraction: 5.7 Gy
Plan Total Fractions Prescribed: 5
Plan Total Prescribed Dose: 28.5 Gy
Reference Point Dosage Given to Date: 5.7 Gy
Reference Point Session Dosage Given: 5.7 Gy
Session Number: 1

## 2022-06-29 ENCOUNTER — Ambulatory Visit
Admission: RE | Admit: 2022-06-29 | Discharge: 2022-06-29 | Disposition: A | Discharge status: Home / Self Care | Payer: Medicare Other | Source: Ambulatory Visit | Attending: Radiation Oncology | Admitting: Radiation Oncology

## 2022-06-29 ENCOUNTER — Other Ambulatory Visit: Payer: Self-pay

## 2022-06-29 DIAGNOSIS — Z510 Encounter for antineoplastic radiation therapy: Secondary | ICD-10-CM | POA: Diagnosis not present

## 2022-06-29 DIAGNOSIS — C50211 Malignant neoplasm of upper-inner quadrant of right female breast: Secondary | ICD-10-CM | POA: Diagnosis not present

## 2022-06-29 DIAGNOSIS — Z170 Estrogen receptor positive status [ER+]: Secondary | ICD-10-CM | POA: Diagnosis not present

## 2022-06-29 LAB — RAD ONC ARIA SESSION SUMMARY
Course Elapsed Days: 7
Plan Fractions Treated to Date: 2
Plan Prescribed Dose Per Fraction: 5.7 Gy
Plan Total Fractions Prescribed: 5
Plan Total Prescribed Dose: 28.5 Gy
Reference Point Dosage Given to Date: 11.4 Gy
Reference Point Session Dosage Given: 5.7 Gy
Session Number: 2

## 2022-07-06 ENCOUNTER — Inpatient Hospital Stay: Payer: Medicare Other | Attending: Hematology and Oncology | Admitting: Hematology and Oncology

## 2022-07-06 ENCOUNTER — Other Ambulatory Visit: Payer: Self-pay

## 2022-07-06 ENCOUNTER — Encounter: Payer: Self-pay | Admitting: Hematology and Oncology

## 2022-07-06 ENCOUNTER — Ambulatory Visit
Admission: RE | Admit: 2022-07-06 | Discharge: 2022-07-06 | Disposition: A | Discharge status: Home / Self Care | Payer: Medicare Other | Source: Ambulatory Visit | Attending: Radiation Oncology | Admitting: Radiation Oncology

## 2022-07-06 DIAGNOSIS — M8580 Other specified disorders of bone density and structure, unspecified site: Secondary | ICD-10-CM | POA: Diagnosis not present

## 2022-07-06 DIAGNOSIS — Z170 Estrogen receptor positive status [ER+]: Secondary | ICD-10-CM | POA: Diagnosis not present

## 2022-07-06 DIAGNOSIS — Z79811 Long term (current) use of aromatase inhibitors: Secondary | ICD-10-CM | POA: Insufficient documentation

## 2022-07-06 DIAGNOSIS — Z79899 Other long term (current) drug therapy: Secondary | ICD-10-CM | POA: Insufficient documentation

## 2022-07-06 DIAGNOSIS — C50211 Malignant neoplasm of upper-inner quadrant of right female breast: Secondary | ICD-10-CM | POA: Diagnosis not present

## 2022-07-06 DIAGNOSIS — Z510 Encounter for antineoplastic radiation therapy: Secondary | ICD-10-CM | POA: Diagnosis not present

## 2022-07-06 LAB — RAD ONC ARIA SESSION SUMMARY
Course Elapsed Days: 14
Plan Fractions Treated to Date: 3
Plan Prescribed Dose Per Fraction: 5.7 Gy
Plan Total Fractions Prescribed: 5
Plan Total Prescribed Dose: 28.5 Gy
Reference Point Dosage Given to Date: 17.1 Gy
Reference Point Session Dosage Given: 5.7 Gy
Session Number: 3

## 2022-07-06 MED ORDER — ANASTROZOLE 1 MG PO TABS: 1.0000 mg | ORAL_TABLET | Freq: Every day | ORAL | 3 refills | Status: DC

## 2022-07-06 NOTE — Progress Notes (Signed)
[facility] NOTE  Patient Care Team: Lavone Orn, MD as PCP - General (Internal Medicine) Mauro Kaufmann, RN as Oncology Nurse Navigator Rockwell Germany, RN as Oncology Nurse Navigator Eppie Gibson, MD as Attending Physician (Radiation Oncology) Benay Pike, MD as Consulting Physician (Hematology and Oncology) Coralie Keens, MD as Consulting Physician (General Surgery)  CHIEF COMPLAINTS/PURPOSE OF CONSULTATION:  Right breast cancer follow-up  HISTORY OF PRESENTING ILLNESS:  Rachel Barrett 76 y.o. female is here because of recent diagnosis of right breast invasive ductal carcinoma  I reviewed her records extensively and collaborated the history with the patient.  SUMMARY OF ONCOLOGIC HISTORY: Oncology History  Malignant neoplasm of upper-inner quadrant of right breast in female, estrogen receptor positive ([place])  03/25/2022 Mammogram   Patient had screening mammogram which showed small architectural distortion in the inner right breast.  Diagnostic mammogram confirmed the architectural distortion and stereotactic biopsy was recommended.   04/15/2022 Initial Diagnosis   Malignant neoplasm of upper-inner quadrant of right breast in female, estrogen receptor positive ([place])   04/19/2022 Cancer Staging   Staging form: Breast, AJCC 8th Edition - Clinical: Stage IA (cT1, cN0, cM0, G1, ER+, PR+, HER2-) - Signed by Benay Pike, MD on 04/19/2022 Histologic grading system: 3 grade system   04/20/2022 Pathology Results   Pathology results showed invasive ductal carcinoma, grade 1, atypical ductal hyperplasia with calcifications, ER 100% positive strong staining intensity, PR 2% positive strong staining intensity, Ki-67 of 1% and tumor cells are negative for HER2 1+   04/28/2022 Surgery   She had right breast lumpectomy.  No residual malignancy identified, negative for invasive as well as in situ carcinoma.  Prior biopsy site present.  Fibroadenomatoid mastopathy  noted.  Previous excision showed 11 mm invasive ductal carcinoma, grade 1 with ER +100% strong staining intensity, PR 2% positive strong staining intensity, Ki-67 of 1% and negative for HER2 1+.   05/05/2022 Genetic Testing   Negative genetic testing on the CancerNext-Expanded+RNAinsight.  The report date is May 245, 2023.  The CancerNext-Expanded gene panel offered by [facility] and includes sequencing and rearrangement analysis for the following 77 genes: AIP, ALK, APC*, ATM*, AXIN2, BAP1, BARD1, BLM, BMPR1A, BRCA1*, BRCA2*, BRIP1*, CDC73, CDH1*, CDK4, CDKN1B, CDKN2A, CHEK2*, CTNNA1, DICER1, FANCC, FH, FLCN, GALNT12, KIF1B, LZTR1, MAX, MEN1, MET, MLH1*, MSH2*, MSH3, MSH6*, MUTYH*, NBN, NF1*, NF2, NTHL1, PALB2*, PHOX2B, PMS2*, POT1, PRKAR1A, PTCH1, PTEN*, RAD51C*, RAD51D*, RB1, RECQL, RET, SDHA, SDHAF2, SDHB, SDHC, SDHD, SMAD4, SMARCA4, SMARCB1, SMARCE1, STK11, SUFU, TMEM127, TP53*, TSC1, TSC2, VHL and XRCC2 (sequencing and deletion/duplication); EGFR, EGLN1, HOXB13, KIT, MITF, PDGFRA, POLD1, and POLE (sequencing only); EPCAM and GREM1 (deletion/duplication only). DNA and RNA analyses performed for * genes.     Interval history  Since last visit she had been receiving radiation once a week for 5 weeks per the fast trial.   She is here to discuss antiestrogen therapy after completion of adjuvant radiation.  She has 2 more weeks left.  So far she has been tolerating it well except for some fatigue.  No skin changes reported to me today. Rest of the pertinent 10 point ROS reviewed and negative  MEDICAL HISTORY:  Past Medical History:  Diagnosis Date   ALLERGIC RHINITIS 02/17/2010   Allergy    ASTHMA 02/17/2010   Asthma    Family history of breast cancer    Family history of kidney cancer    Family history of prostate cancer    Family history of stomach cancer  GERD 02/17/2010   Glaucoma    GOITER, MULTINODULAR 02/17/2010   HYPERGLYCEMIA 02/22/2011   HYPERTENSION 02/17/2010   IBS  02/17/2010   Osteopenia 11/2018   T score -1.9 FRAX 7.9% / 1.4%   Retina hole    Unspecified disorder of liver 02/22/2011    SURGICAL HISTORY: Past Surgical History:  Procedure Laterality Date   ABDOMINAL HYSTERECTOMY  1989   Leiomyoma   Bladder Bx  2009   BREAST EXCISIONAL BIOPSY Left 1999   BREAST LUMPECTOMY WITH RADIOACTIVE SEED LOCALIZATION Right 04/28/2022   Procedure: RIGHT BREAST LUMPECTOMY WITH RADIOACTIVE SEED LOCALIZATION;  Surgeon: Coralie Keens, MD;  Location: [place];  Service: General;  Laterality: Right;  LMA   BREAST SURGERY     Breast cyst   CHOLECYSTECTOMY     EYE SURGERY     EYE SURGERY     Hole in Retina     NOSE SURGERY      SOCIAL HISTORY: Social History   Socioeconomic History   Marital status: Married    Spouse name: Not on file   Number of children: Not on file   Years of education: Not on file   Highest education level: Not on file  Occupational History   Occupation: Administration    Employer: [employer]  Tobacco Use   Smoking status: Never   Smokeless tobacco: Never  Vaping Use   Vaping Use: Never used  Substance and Sexual Activity   Alcohol use: Never   Drug use: No   Sexual activity: Not Currently    Birth control/protection: Surgical    Comment: 1st intercourse 76 yo-Fewer than 5 partners  Other Topics Concern   Not on file  Social History Narrative   Not on file   Social Determinants of Health   Financial Resource Strain: Not on file  Food Insecurity: No Food Insecurity (04/22/2022)   Hunger Vital Sign    Worried About Running Out of Food in the Last Year: Never true    Ran Out of Food in the Last Year: Never true  Transportation Needs: No Transportation Needs (04/22/2022)   PRAPARE - Hydrologist (Medical): No    Lack of Transportation (Non-Medical): No  Physical Activity: Not on file  Stress: Not on file  Social Connections: Not on file  Intimate Partner Violence: Not  on file    FAMILY HISTORY: Family History  Problem Relation Age of Onset   Heart attack Mother        at age of 53 died in her sleep "massive heart attack"   Goiter Mother        benign   Hypertension Father    Heart disease Father    Hypertension Sister    Breast cancer Sister 60   Hypertension Brother    Lung cancer Brother    Cancer Brother        Peritoneal cancer   Heart disease Maternal Grandmother    Heart disease Maternal Grandfather    Heart disease Paternal Grandmother    Heart disease Paternal Grandfather    Breast cancer Maternal Aunt    Prostate cancer Maternal Uncle        4 maternal uncles   Lung cancer Maternal Uncle    Stomach cancer Paternal Aunt    Liver cancer Paternal Aunt    Kidney cancer Paternal Uncle    Stomach cancer Cousin        maternal cousin   Stomach cancer Other  MGMs sister   Diabetes Neg Hx     ALLERGIES:  is allergic to other, augmentin [amoxicillin-pot clavulanate], clarithromycin, diltiazem hcl, terazosin, and bactrim [sulfamethoxazole-trimethoprim].  MEDICATIONS:  Current Outpatient Medications  Medication Sig Dispense Refill   anastrozole (ARIMIDEX) 1 MG tablet Take 1 tablet (1 mg total) by mouth daily. 90 tablet 3   albuterol (VENTOLIN HFA) 108 (90 Base) MCG/ACT inhaler Inhale 2 puffs into the lungs as needed.       amLODipine (NORVASC) 5 MG tablet Take 5 mg by mouth daily.       budesonide-formoterol (SYMBICORT) 160-4.5 MCG/ACT inhaler Inhale 2 puffs into the lungs 2 (two) times daily.     fluticasone (FLONASE) 50 MCG/ACT nasal spray Place 2 sprays into the nose as needed.       latanoprost (XALATAN) 0.005 % ophthalmic solution 1 drop at bedtime.     omeprazole (PRILOSEC) 20 MG capsule Take 20 mg by mouth daily as needed.     potassium chloride SA (K-DUR,KLOR-CON) 20 MEQ tablet Take 20 mEq by mouth daily.       timolol (TIMOPTIC) 0.5 % ophthalmic solution      traMADol (ULTRAM) 50 MG tablet Take 1 tablet (50 mg total)  by mouth every 6 (six) hours as needed for moderate pain or severe pain. 20 tablet 0   valsartan-hydrochlorothiazide (DIOVAN-HCT) 160-25 MG per tablet Take 1 tablet by mouth daily.       No current facility-administered medications for this visit.    REVIEW OF SYSTEMS:   Constitutional: Denies fevers, chills or abnormal night sweats Eyes: Denies blurriness of vision, double vision or watery eyes Ears, nose, mouth, throat, and face: Denies mucositis or sore throat Respiratory: Denies cough, dyspnea or wheezes Cardiovascular: Denies palpitation, chest discomfort or lower extremity swelling Gastrointestinal:  Denies nausea, heartburn or change in bowel habits Skin: Denies abnormal skin rashes Lymphatics: Denies new lymphadenopathy or easy bruising Neurological:Denies numbness, tingling or new weaknesses Behavioral/Psych: Mood is stable, no new changes  Breast: Denies any palpable lumps or discharge All other systems were reviewed with the patient and are negative.  PHYSICAL EXAMINATION: ECOG PERFORMANCE STATUS: 0 - Asymptomatic  Vitals:   07/06/22 1401  BP: (!) 146/75  Pulse: (!) 57  Resp: 19  Temp: 98.2 F (36.8 C)  SpO2: 98%    Filed Weights   07/06/22 1401  Weight: 183 lb 1.6 oz (83.1 kg)     GENERAL:alert, no distress and comfortable Rest of the physical exam deferred in lieu of counseling LABORATORY DATA:  I have reviewed the data as listed Lab Results  Component Value Date   WBC 7.3 04/19/2022   HGB 13.2 04/19/2022   HCT 39.0 04/19/2022   MCV 87.8 04/19/2022   PLT 243 04/19/2022   Lab Results  Component Value Date   NA 140 04/19/2022   K 3.5 04/19/2022   CL 105 04/19/2022   CO2 29 04/19/2022    RADIOGRAPHIC STUDIES: I have personally reviewed the radiological reports and agreed with the findings in the report.  ASSESSMENT AND PLAN:  Malignant neoplasm of upper-inner quadrant of right breast in female, estrogen receptor positive ([place]) This is a very  pleasant 76 year old female patient with newly diagnosed right breast invasive ductal carcinoma, grade 1, ER +100% strong staining, PR 2% positive strong staining, Ki-67 of 1% and HER2 negative status post right lumpectomy with no residual malignancy.  She is now undergoing radiation once a week for 5 weeks per the fast trial with Dr. Isidore Moos.  She is here to talk about adjuvant antiestrogen therapy. We have once again discussed the options of antiestrogen therapy including tamoxifen versus aromatase inhibitors, adverse effects with each class.  She is agreeable to taking antiestrogen therapy after completion of radiation.  Anastrozole dispensed to the pharmacy of her choice.  She was clearly instructed to wait until she completes radiation. Last bone density January 2022 with mild osteopenia, no overt concerns we encouraged her to continue calcium/vitamin D and weightbearing exercises.  We will repeat bone density in January 2024. She will proceed with survivorship clinic in 3 months and return to clinic to see me in 6 months.  She was however strongly encouraged to call us with any new questions or concerns.  Total time spent: 30 minutes  All questions were answered. The patient knows to call the clinic with any problems, questions or concerns.    Benay Pike, MD 07/06/22

## 2022-07-06 NOTE — Assessment & Plan Note (Addendum)
This is a very pleasant 76 year old female patient with newly diagnosed right breast invasive ductal carcinoma, grade 1, ER +100% strong staining, PR 2% positive strong staining, Ki-67 of 1% and HER2 negative status post right lumpectomy with no residual malignancy.  She is now undergoing radiation once a week for 5 weeks per the fast trial with Dr. Isidore Moos.  She is here to talk about adjuvant antiestrogen therapy. We have once again discussed the options of antiestrogen therapy including tamoxifen versus aromatase inhibitors, adverse effects with each class.  She is agreeable to taking antiestrogen therapy after completion of radiation.  Anastrozole dispensed to the pharmacy of her choice.  She was clearly instructed to wait until she completes radiation. Last bone density January 2022 with mild osteopenia, no overt concerns we encouraged her to continue calcium/vitamin D and weightbearing exercises.  We will repeat bone density in January 2024. She will proceed with survivorship clinic in 3 months and return to clinic to see me in 6 months.  She was however strongly encouraged to call us with any new questions or concerns.

## 2022-07-13 ENCOUNTER — Ambulatory Visit
Admission: RE | Admit: 2022-07-13 | Discharge: 2022-07-13 | Disposition: A | Discharge status: Home / Self Care | Payer: Medicare Other | Source: Ambulatory Visit | Attending: Radiation Oncology | Admitting: Radiation Oncology

## 2022-07-13 ENCOUNTER — Ambulatory Visit: Payer: Medicare Other

## 2022-07-13 ENCOUNTER — Other Ambulatory Visit: Payer: Self-pay

## 2022-07-13 DIAGNOSIS — C50211 Malignant neoplasm of upper-inner quadrant of right female breast: Secondary | ICD-10-CM | POA: Diagnosis not present

## 2022-07-13 DIAGNOSIS — Z510 Encounter for antineoplastic radiation therapy: Secondary | ICD-10-CM | POA: Diagnosis not present

## 2022-07-13 DIAGNOSIS — Z170 Estrogen receptor positive status [ER+]: Secondary | ICD-10-CM | POA: Diagnosis not present

## 2022-07-13 LAB — RAD ONC ARIA SESSION SUMMARY
Course Elapsed Days: 21
Plan Fractions Treated to Date: 4
Plan Prescribed Dose Per Fraction: 5.7 Gy
Plan Total Fractions Prescribed: 5
Plan Total Prescribed Dose: 28.5 Gy
Reference Point Dosage Given to Date: 22.8 Gy
Reference Point Session Dosage Given: 5.7 Gy
Session Number: 4

## 2022-07-19 ENCOUNTER — Encounter: Payer: Self-pay | Admitting: *Deleted

## 2022-07-19 DIAGNOSIS — C50211 Malignant neoplasm of upper-inner quadrant of right female breast: Secondary | ICD-10-CM

## 2022-07-20 ENCOUNTER — Other Ambulatory Visit: Payer: Self-pay

## 2022-07-20 ENCOUNTER — Ambulatory Visit
Admission: RE | Admit: 2022-07-20 | Discharge: 2022-07-20 | Disposition: A | Discharge status: Home / Self Care | Payer: Medicare Other | Source: Ambulatory Visit | Attending: Radiation Oncology | Admitting: Radiation Oncology

## 2022-07-20 ENCOUNTER — Encounter: Payer: Self-pay | Admitting: Radiation Oncology

## 2022-07-20 DIAGNOSIS — Z510 Encounter for antineoplastic radiation therapy: Secondary | ICD-10-CM | POA: Diagnosis not present

## 2022-07-20 DIAGNOSIS — Z170 Estrogen receptor positive status [ER+]: Secondary | ICD-10-CM | POA: Diagnosis not present

## 2022-07-20 DIAGNOSIS — C50211 Malignant neoplasm of upper-inner quadrant of right female breast: Secondary | ICD-10-CM | POA: Diagnosis not present

## 2022-07-20 LAB — RAD ONC ARIA SESSION SUMMARY
Course Elapsed Days: 28
Plan Fractions Treated to Date: 5
Plan Prescribed Dose Per Fraction: 5.7 Gy
Plan Total Fractions Prescribed: 5
Plan Total Prescribed Dose: 28.5 Gy
Reference Point Dosage Given to Date: 28.5 Gy
Reference Point Session Dosage Given: 5.7 Gy
Session Number: 5

## 2022-08-04 NOTE — Progress Notes (Signed)
                                                                                                                                                             Patient Name: Rachel Barrett MRN: [id number] DOB: Dec 18, 1945 Referring Physician: Benay Pike Date of Service: 07/20/2022 [facility], [place]                                                        End Of Treatment Note  Diagnoses: C50.211-Malignant neoplasm of upper-inner quadrant of right female breast  Cancer Staging:  Cancer Staging  Malignant neoplasm of upper-inner quadrant of right breast in female, estrogen receptor positive ([place]) Staging form: Breast, AJCC 8th Edition - Clinical: Stage IA (cT1, cN0, cM0, G1, ER+, PR+, HER2-) - Signed by Benay Pike, MD on 04/19/2022 Histologic grading system: 3 grade system   Intent: Curative  Radiation Treatment Dates: 06/22/2022 through 07/20/2022 Site Technique Total Dose (Gy) Dose per Fx (Gy) Completed Fx Beam Energies  Breast, Right: Breast_R 3D 28.5/28.5 5.7 5/5 10X   Narrative: The patient tolerated radiation therapy relatively well.   Plan: The patient will follow-up with radiation oncology in 31mo.  -----------------------------------  SEppie Gibson MD

## 2022-08-13 ENCOUNTER — Telehealth: Payer: Self-pay | Admitting: *Deleted

## 2022-08-13 NOTE — Telephone Encounter (Signed)
RETURNED PATIENT'S PHONE CALL, SPOKE WITH PATIENT. ?

## 2022-08-20 ENCOUNTER — Ambulatory Visit: Payer: Medicare Other | Admitting: Radiation Oncology

## 2022-08-31 NOTE — Progress Notes (Signed)
"  I called the patient today about her upcoming follow-up appointment in radiation oncology.   Given the state of the COVID-19 pandemic, concerning case numbers in our community, and guidance from [facility], I offered a phone assessment with the patient to determine if coming to the clinic was necessary. She accepted.  The patient denies any symptomatic concerns.  Specifically, they report good healing of their skin in the radiation fields.  Skin is intact.    I recommended that she continue skin care by applying oil or lotion with vitamin E to the skin in the radiation fields, BID, for 2 more months.  Continue follow-up with medical oncology - follow-up is scheduled on 10/07/2022 with [facility] survivorship.  I explained that yearly mammograms are important for patients with intact breast tissue, and physical exams are important after mastectomy for patients that cannot undergo mammography.  I encouraged her to call if she had further questions or concerns about her healing. Otherwise, she will follow-up PRN in radiation oncology. Patient is pleased with this plan, and we will cancel her upcoming follow-up to reduce the risk of COVID-19 transmission.

## 2022-09-07 ENCOUNTER — Ambulatory Visit
Admission: RE | Admit: 2022-09-07 | Discharge: 2022-09-07 | Disposition: A | Discharge status: Home / Self Care | Payer: Medicare Other | Source: Ambulatory Visit | Attending: Radiation Oncology | Admitting: Radiation Oncology

## 2022-09-07 DIAGNOSIS — Z170 Estrogen receptor positive status [ER+]: Secondary | ICD-10-CM | POA: Insufficient documentation

## 2022-09-07 DIAGNOSIS — C50211 Malignant neoplasm of upper-inner quadrant of right female breast: Secondary | ICD-10-CM | POA: Insufficient documentation

## 2022-09-07 NOTE — Progress Notes (Signed)
Rn spoke with Ms. Rachel Barrett today @ 1100. States that she is doing well and that she does not feel as if she needs to come in person for her appointment today. A telephone appointment was conducted.

## 2022-10-07 ENCOUNTER — Other Ambulatory Visit: Payer: Self-pay

## 2022-10-07 ENCOUNTER — Inpatient Hospital Stay: Payer: Medicare Other | Attending: Adult Health | Admitting: Adult Health

## 2022-10-07 ENCOUNTER — Encounter: Payer: Self-pay | Admitting: Hematology and Oncology

## 2022-10-07 ENCOUNTER — Encounter: Payer: Self-pay | Admitting: Adult Health

## 2022-10-07 VITALS — BP 144/83 | HR 73 | Temp 98.1°F | Resp 16 | Ht 63.0 in | Wt 179.8 lb

## 2022-10-07 DIAGNOSIS — Z79811 Long term (current) use of aromatase inhibitors: Secondary | ICD-10-CM | POA: Diagnosis not present

## 2022-10-07 DIAGNOSIS — C50211 Malignant neoplasm of upper-inner quadrant of right female breast: Secondary | ICD-10-CM | POA: Diagnosis not present

## 2022-10-07 DIAGNOSIS — Z170 Estrogen receptor positive status [ER+]: Secondary | ICD-10-CM | POA: Diagnosis not present

## 2022-10-07 DIAGNOSIS — Z923 Personal history of irradiation: Secondary | ICD-10-CM | POA: Diagnosis not present

## 2022-10-07 DIAGNOSIS — M85852 Other specified disorders of bone density and structure, left thigh: Secondary | ICD-10-CM | POA: Insufficient documentation

## 2022-10-07 DIAGNOSIS — Z79899 Other long term (current) drug therapy: Secondary | ICD-10-CM | POA: Diagnosis not present

## 2022-10-07 NOTE — Progress Notes (Signed)
SURVIVORSHIP VISIT:   BRIEF ONCOLOGIC HISTORY:  Oncology History  Malignant neoplasm of upper-inner quadrant of right breast in female, estrogen receptor positive ([place])  03/25/2022 Mammogram   Patient had screening mammogram which showed small architectural distortion in the inner right breast.  Diagnostic mammogram confirmed the architectural distortion and stereotactic biopsy was recommended.   04/15/2022 Initial Diagnosis   Malignant neoplasm of upper-inner quadrant of right breast in female, estrogen receptor positive ([place])   04/19/2022 Cancer Staging   Staging form: Breast, AJCC 8th Edition - Clinical: Stage IA (cT1, cN0, cM0, G1, ER+, PR+, HER2-) - Signed by Benay Pike, MD on 04/19/2022 Histologic grading system: 3 grade system   04/20/2022 Pathology Results   Pathology results showed invasive ductal carcinoma, grade 1, atypical ductal hyperplasia with calcifications, ER 100% positive strong staining intensity, PR 2% positive strong staining intensity, Ki-67 of 1% and tumor cells are negative for HER2 1+   04/28/2022 Surgery   She had right breast lumpectomy.  No residual malignancy identified, negative for invasive as well as in situ carcinoma.  Prior biopsy site present.  Fibroadenomatoid mastopathy noted.  Previous excision showed 11 mm invasive ductal carcinoma, grade 1 with ER +100% strong staining intensity, PR 2% positive strong staining intensity, Ki-67 of 1% and negative for HER2 1+.   05/05/2022 Genetic Testing   Negative genetic testing on the CancerNext-Expanded+RNAinsight.  The report date is May 245, 2023.  The CancerNext-Expanded gene panel offered by [facility] and includes sequencing and rearrangement analysis for the following 77 genes: AIP, ALK, APC*, ATM*, AXIN2, BAP1, BARD1, BLM, BMPR1A, BRCA1*, BRCA2*, BRIP1*, CDC73, CDH1*, CDK4, CDKN1B, CDKN2A, CHEK2*, CTNNA1, DICER1, FANCC, FH, FLCN, GALNT12, KIF1B, LZTR1, MAX, MEN1, MET, MLH1*, MSH2*, MSH3, MSH6*, MUTYH*, NBN,  NF1*, NF2, NTHL1, PALB2*, PHOX2B, PMS2*, POT1, PRKAR1A, PTCH1, PTEN*, RAD51C*, RAD51D*, RB1, RECQL, RET, SDHA, SDHAF2, SDHB, SDHC, SDHD, SMAD4, SMARCA4, SMARCB1, SMARCE1, STK11, SUFU, TMEM127, TP53*, TSC1, TSC2, VHL and XRCC2 (sequencing and deletion/duplication); EGFR, EGLN1, HOXB13, KIT, MITF, PDGFRA, POLD1, and POLE (sequencing only); EPCAM and GREM1 (deletion/duplication only). DNA and RNA analyses performed for * genes.    06/22/2022 - 07/20/2022 Radiation Therapy   Site Technique Total Dose (Gy) Dose per Fx (Gy) Completed Fx Beam Energies  Breast, Right: Breast_R 3D 28.5/28.5 5.7 5/5 10X     07/06/2022 -  Anti-estrogen oral therapy   Anastrozole     INTERVAL HISTORY:  Rachel Barrett to review her survivorship care plan detailing her treatment course for breast cancer, as well as monitoring long-term side effects of that treatment, education regarding health maintenance, screening, and overall wellness and health promotion.    She is taking anastrozole daily and experiences some hot flashes.  These occur mainly at night and occasionally wake her up at night.    She has asthma, and is using symbicort BID, and has needed her rescue inhaler twice a day chronically.  She wonders if she needs to see the allergy and asthma clinic, but is seeing Dr. Lysle Rubens in about 6 weeks for her physical and plans to talk to him about this during this visit. She is walking and active as it relates to taking care of her husband (see below).  Her husband has been ill lateley, underwent a recent vascular surgery.  He is on dialysis, and has lost blood flow in his foot.  He has increased pain related to this and Rachel Barrett and her son are caring for him.     REVIEW OF SYSTEMS:  Review of Systems  Constitutional:  Negative for appetite change, chills, fatigue, fever and unexpected weight change.  HENT:   Negative for hearing loss, lump/mass and trouble swallowing.   Eyes:  Negative for eye problems and icterus.  Respiratory:   Negative for chest tightness, cough and shortness of breath.   Cardiovascular:  Negative for chest pain, leg swelling and palpitations.  Gastrointestinal:  Negative for abdominal distention, abdominal pain, constipation, diarrhea, nausea and vomiting.  Endocrine: Negative for hot flashes.  Genitourinary:  Negative for difficulty urinating.   Musculoskeletal:  Negative for arthralgias.  Skin:  Negative for itching and rash.  Neurological:  Negative for dizziness, extremity weakness, headaches and numbness.  Hematological:  Negative for adenopathy. Does not bruise/bleed easily.  Psychiatric/Behavioral:  Negative for depression. The patient is not nervous/anxious.   Breast: Denies any new nodularity, masses, tenderness, nipple changes, or nipple discharge.      ONCOLOGY TREATMENT TEAM:  1. Surgeon:  Dr. Ninfa Linden at [facility] 2. Medical Oncologist: Dr. Chryl Heck  3. Radiation Oncologist: Dr. Isidore Moos    PAST MEDICAL/SURGICAL HISTORY:  Past Medical History:  Diagnosis Date   ALLERGIC RHINITIS 02/17/2010   Allergy    ASTHMA 02/17/2010   Asthma    Family history of breast cancer    Family history of kidney cancer    Family history of prostate cancer    Family history of stomach cancer    GERD 02/17/2010   Glaucoma    GOITER, MULTINODULAR 02/17/2010   HYPERGLYCEMIA 02/22/2011   HYPERTENSION 02/17/2010   IBS 02/17/2010   Osteopenia 11/2018   T score -1.9 FRAX 7.9% / 1.4%   Retina hole    Unspecified disorder of liver 02/22/2011   Past Surgical History:  Procedure Laterality Date   ABDOMINAL HYSTERECTOMY  1989   Leiomyoma   Bladder Bx  2009   BREAST EXCISIONAL BIOPSY Left 1999   BREAST LUMPECTOMY WITH RADIOACTIVE SEED LOCALIZATION Right 04/28/2022   Procedure: RIGHT BREAST LUMPECTOMY WITH RADIOACTIVE SEED LOCALIZATION;  Surgeon: Coralie Keens, MD;  Location: [facility];  Service: General;  Laterality: Right;  LMA   BREAST SURGERY     Breast cyst    CHOLECYSTECTOMY     EYE SURGERY     EYE SURGERY     Hole in Retina     NOSE SURGERY       ALLERGIES:  Allergies  Allergen Reactions   Other     Other reaction(s): Other (See Comments) bacitacin-races heart   Augmentin [Amoxicillin-Pot Clavulanate]    Clarithromycin    Diltiazem Hcl Other (See Comments)   Terazosin Other (See Comments) and Swelling   Bactrim [Sulfamethoxazole-Trimethoprim] Palpitations     CURRENT MEDICATIONS:  Outpatient Encounter Medications as of 10/07/2022  Medication Sig   albuterol (VENTOLIN HFA) 108 (90 Base) MCG/ACT inhaler Inhale 2 puffs into the lungs as needed.     amLODipine (NORVASC) 5 MG tablet Take 5 mg by mouth daily.     anastrozole (ARIMIDEX) 1 MG tablet Take 1 tablet (1 mg total) by mouth daily.   budesonide-formoterol (SYMBICORT) 160-4.5 MCG/ACT inhaler Inhale 2 puffs into the lungs 2 (two) times daily.   fluticasone (FLONASE) 50 MCG/ACT nasal spray Place 2 sprays into the nose as needed.     latanoprost (XALATAN) 0.005 % ophthalmic solution 1 drop at bedtime.   omeprazole (PRILOSEC) 20 MG capsule Take 20 mg by mouth daily as needed.   potassium chloride SA (K-DUR,KLOR-CON) 20 MEQ tablet Take 20 mEq by mouth daily.  timolol (TIMOPTIC) 0.5 % ophthalmic solution    traMADol (ULTRAM) 50 MG tablet Take 1 tablet (50 mg total) by mouth every 6 (six) hours as needed for moderate pain or severe pain.   valsartan-hydrochlorothiazide (DIOVAN-HCT) 160-25 MG per tablet Take 1 tablet by mouth daily.     No facility-administered encounter medications on file as of 10/07/2022.     ONCOLOGIC FAMILY HISTORY:  Family History  Problem Relation Age of Onset   Heart attack Mother        at age of 30 died in her sleep "massive heart attack"   Goiter Mother        benign   Hypertension Father    Heart disease Father    Hypertension Sister    Breast cancer Sister 35   Hypertension Brother    Lung cancer Brother    Cancer Brother        Peritoneal  cancer   Heart disease Maternal Grandmother    Heart disease Maternal Grandfather    Heart disease Paternal Grandmother    Heart disease Paternal Grandfather    Breast cancer Maternal Aunt    Prostate cancer Maternal Uncle        4 maternal uncles   Lung cancer Maternal Uncle    Stomach cancer Paternal Aunt    Liver cancer Paternal Aunt    Kidney cancer Paternal Uncle    Stomach cancer Cousin        maternal cousin   Stomach cancer Other        MGMs sister   Diabetes Neg Hx       SOCIAL HISTORY:  Social History   Socioeconomic History   Marital status: Married    Spouse name: Not on file   Number of children: Not on file   Years of education: Not on file   Highest education level: Not on file  Occupational History   Occupation: Administration    Employer: [employer]  Tobacco Use   Smoking status: Never   Smokeless tobacco: Never  Vaping Use   Vaping Use: Never used  Substance and Sexual Activity   Alcohol use: Never   Drug use: No   Sexual activity: Not Currently    Birth control/protection: Surgical    Comment: 1st intercourse 76 yo-Fewer than 5 partners  Other Topics Concern   Not on file  Social History Narrative   Not on file   Social Determinants of Health   Financial Resource Strain: Not on file  Food Insecurity: No Food Insecurity (04/22/2022)   Hunger Vital Sign    Worried About Running Out of Food in the Last Year: Never true    Ran Out of Food in the Last Year: Never true  Transportation Needs: No Transportation Needs (04/22/2022)   PRAPARE - Hydrologist (Medical): No    Lack of Transportation (Non-Medical): No  Physical Activity: Not on file  Stress: Not on file  Social Connections: Not on file  Intimate Partner Violence: Not on file     OBSERVATIONS/OBJECTIVE:  BP (!) 144/83 (BP Location: Left Arm, Patient Position: Sitting)   Pulse 73   Temp 98.1 F (36.7 C) (Temporal)   Resp 16   Ht 5' 3" (1.6 m)   Wt  179 lb 12.8 oz (81.6 kg)   BMI 31.85 kg/m  GENERAL: Patient is a well appearing female in no acute distress HEENT:  Sclerae anicteric.  Oropharynx clear and moist. No ulcerations or evidence of  oropharyngeal candidiasis. Neck is supple.  NODES:  No cervical, supraclavicular, or axillary lymphadenopathy palpated.  BREAST EXAM: Right breast status postlumpectomy and radiation no sign of local recurrence left breast benign. LUNGS:  Clear to auscultation bilaterally.  No wheezes or rhonchi. HEART:  Regular rate and rhythm. No murmur appreciated. ABDOMEN:  Soft, nontender.  Positive, normoactive bowel sounds. No organomegaly palpated. MSK:  No focal spinal tenderness to palpation. Full range of motion bilaterally in the upper extremities. EXTREMITIES:  No peripheral edema.   SKIN:  Clear with no obvious rashes or skin changes. No nail dyscrasia. NEURO:  Nonfocal. Well oriented.  Appropriate affect.  LABORATORY DATA:  None for this visit.  DIAGNOSTIC IMAGING:  None for this visit.      ASSESSMENT AND PLAN:  Rachel Barrett is a pleasant 76 y.o. female with Stage 1A right breast invasive ductal carcinoma, ER+/PR+/HER2-, diagnosed in 04/2022, treated with lumpectomy, adjuvant radiation therapy, and anti-estrogen therapy with anastrozole beginning in July 2023.  She presents to the Survivorship Clinic for our initial meeting and routine follow-up post-completion of treatment for breast cancer.    1. Stage IA right breast cancer:  Rachel Barrett is continuing to recover from definitive treatment for breast cancer. She will follow-up with her medical oncologist, Dr. Chryl Heck in January 2024 with history and physical exam per surveillance protocol.  She will continue her anti-estrogen therapy with anastrozole. Thus far, she is tolerating the anastrozole well, with minimal side effects. Her mammogram is due March 2024; orders placed today.   Today, a comprehensive survivorship care plan and treatment summary was  reviewed with the patient today detailing her breast cancer diagnosis, treatment course, potential late/long-term effects of treatment, appropriate follow-up care with recommendations for the future, and patient education resources.  A copy of this summary, along with a letter will be sent to the patient's primary care provider via mail/fax/In Basket message after today's visit.    2. Bone health:  Given Rachel Barrett's age/history of breast cancer and her current treatment regimen including anti-estrogen therapy with anastrozole, she is at risk for bone demineralization.  Her last DEXA scan was January 2022 and showed osteopenia with a left femoral neck T score of -2.2.  She should repeat bone density testing in January 2024.  She was given education on specific activities to promote bone health.  3. Cancer screening:  Due to Rachel Barrett history and her age, she should receive screening for skin cancers, colon cancer, and gynecologic cancers.  The information and recommendations are listed on the patient's comprehensive care plan/treatment summary and were reviewed in detail with the patient.    4. Health maintenance and wellness promotion: Rachel Barrett was encouraged to consume 5-7 servings of fruits and vegetables per day. We reviewed the "Nutrition Rainbow" handout.  She was also encouraged to engage in moderate to vigorous exercise for 30 minutes per day most days of the week. We discussed the [employer] fitness program, which is designed for cancer survivors to help them become more physically fit after cancer treatments.  She was instructed to limit her alcohol consumption and continue to abstain from tobacco use.     5. Support services/counseling: It is not uncommon for this period of the patient's cancer care trajectory to be one of many emotions and stressors.    She was given information regarding our available services and encouraged to contact me with any questions or for help enrolling in any of  our support group/programs.  Follow up instructions:    -Return to cancer center 12/2022  -Mammogram due in 02/2023 -Bone Density with Dr. Dellis Filbert in 12/2022 -Follow up with surgery  -She is welcome to return back to the Survivorship Clinic at any time; no additional follow-up needed at this time.  -Consider referral back to survivorship as a long-term survivor for continued surveillance  The patient was provided an opportunity to ask questions and all were answered. The patient agreed with the plan and demonstrated an understanding of the instructions.   Total encounter time:40 minutes*in face-to-face visit time, chart review, lab review, care coordination, order entry, and documentation of the encounter time.    Wilber Bihari, NP 10/07/22 10:26 AM Medical Oncology and Hematology [facility] [place], [place] [address] Tel. [phone]    Fax. [phone]  *Total Encounter Time as defined by the Centers for Medicare and Medicaid Services includes, in addition to the face-to-face time of a patient visit (documented in the note above) non-face-to-face time: obtaining and reviewing outside history, ordering and reviewing medications, tests or procedures, care coordination (communications with other health care professionals or caregivers) and documentation in the medical record.

## 2022-10-22 ENCOUNTER — Encounter: Payer: Self-pay | Admitting: Hematology and Oncology

## 2022-11-25 DIAGNOSIS — Z1159 Encounter for screening for other viral diseases: Secondary | ICD-10-CM | POA: Diagnosis not present

## 2022-11-25 DIAGNOSIS — I10 Essential (primary) hypertension: Secondary | ICD-10-CM | POA: Diagnosis not present

## 2022-11-25 DIAGNOSIS — C50911 Malignant neoplasm of unspecified site of right female breast: Secondary | ICD-10-CM | POA: Diagnosis not present

## 2022-11-25 DIAGNOSIS — E042 Nontoxic multinodular goiter: Secondary | ICD-10-CM | POA: Diagnosis not present

## 2022-11-25 DIAGNOSIS — K219 Gastro-esophageal reflux disease without esophagitis: Secondary | ICD-10-CM | POA: Diagnosis not present

## 2022-11-25 DIAGNOSIS — Z23 Encounter for immunization: Secondary | ICD-10-CM | POA: Diagnosis not present

## 2022-11-25 DIAGNOSIS — Z0000 Encounter for general adult medical examination without abnormal findings: Secondary | ICD-10-CM | POA: Diagnosis not present

## 2022-11-25 DIAGNOSIS — J309 Allergic rhinitis, unspecified: Secondary | ICD-10-CM | POA: Diagnosis not present

## 2022-11-25 DIAGNOSIS — M25559 Pain in unspecified hip: Secondary | ICD-10-CM | POA: Diagnosis not present

## 2022-11-25 DIAGNOSIS — Z13220 Encounter for screening for lipoid disorders: Secondary | ICD-10-CM | POA: Diagnosis not present

## 2022-11-25 DIAGNOSIS — J4530 Mild persistent asthma, uncomplicated: Secondary | ICD-10-CM | POA: Diagnosis not present

## 2023-01-06 ENCOUNTER — Inpatient Hospital Stay: Payer: Medicare Other | Attending: Hematology and Oncology | Admitting: Hematology and Oncology

## 2023-01-06 ENCOUNTER — Encounter: Payer: Self-pay | Admitting: Hematology and Oncology

## 2023-01-06 ENCOUNTER — Other Ambulatory Visit: Payer: Self-pay

## 2023-01-06 VITALS — BP 139/74 | HR 60 | Temp 97.5°F | Resp 18 | Wt 178.1 lb

## 2023-01-06 DIAGNOSIS — H401132 Primary open-angle glaucoma, bilateral, moderate stage: Secondary | ICD-10-CM | POA: Diagnosis not present

## 2023-01-06 DIAGNOSIS — Z79811 Long term (current) use of aromatase inhibitors: Secondary | ICD-10-CM | POA: Diagnosis not present

## 2023-01-06 DIAGNOSIS — Z170 Estrogen receptor positive status [ER+]: Secondary | ICD-10-CM | POA: Insufficient documentation

## 2023-01-06 DIAGNOSIS — C50211 Malignant neoplasm of upper-inner quadrant of right female breast: Secondary | ICD-10-CM | POA: Insufficient documentation

## 2023-01-06 DIAGNOSIS — Z923 Personal history of irradiation: Secondary | ICD-10-CM | POA: Insufficient documentation

## 2023-01-06 NOTE — Progress Notes (Signed)
BRIEF ONCOLOGIC HISTORY:  Oncology History  Malignant neoplasm of upper-inner quadrant of right breast in female, estrogen receptor positive ([place])  03/25/2022 Mammogram   Patient had screening mammogram which showed small architectural distortion in the inner right breast.  Diagnostic mammogram confirmed the architectural distortion and stereotactic biopsy was recommended.   04/15/2022 Initial Diagnosis   Malignant neoplasm of upper-inner quadrant of right breast in female, estrogen receptor positive ([place])   04/19/2022 Cancer Staging   Staging form: Breast, AJCC 8th Edition - Clinical: Stage IA (cT1, cN0, cM0, G1, ER+, PR+, HER2-) - Signed by Benay Pike, MD on 04/19/2022 Histologic grading system: 3 grade system   04/20/2022 Pathology Results   Pathology results showed invasive ductal carcinoma, grade 1, atypical ductal hyperplasia with calcifications, ER 100% positive strong staining intensity, PR 2% positive strong staining intensity, Ki-67 of 1% and tumor cells are negative for HER2 1+   04/28/2022 Surgery   She had right breast lumpectomy.  No residual malignancy identified, negative for invasive as well as in situ carcinoma.  Prior biopsy site present.  Fibroadenomatoid mastopathy noted.  Previous excision showed 11 mm invasive ductal carcinoma, grade 1 with ER +100% strong staining intensity, PR 2% positive strong staining intensity, Ki-67 of 1% and negative for HER2 1+.   05/05/2022 Genetic Testing   Negative genetic testing on the CancerNext-Expanded+RNAinsight.  The report date is May 245, 2023.  The CancerNext-Expanded gene panel offered by [facility] and includes sequencing and rearrangement analysis for the following 77 genes: AIP, ALK, APC*, ATM*, AXIN2, BAP1, BARD1, BLM, BMPR1A, BRCA1*, BRCA2*, BRIP1*, CDC73, CDH1*, CDK4, CDKN1B, CDKN2A, CHEK2*, CTNNA1, DICER1, FANCC, FH, FLCN, GALNT12, KIF1B, LZTR1, MAX, MEN1, MET, MLH1*, MSH2*, MSH3, MSH6*, MUTYH*, NBN, NF1*, NF2, NTHL1,  PALB2*, PHOX2B, PMS2*, POT1, PRKAR1A, PTCH1, PTEN*, RAD51C*, RAD51D*, RB1, RECQL, RET, SDHA, SDHAF2, SDHB, SDHC, SDHD, SMAD4, SMARCA4, SMARCB1, SMARCE1, STK11, SUFU, TMEM127, TP53*, TSC1, TSC2, VHL and XRCC2 (sequencing and deletion/duplication); EGFR, EGLN1, HOXB13, KIT, MITF, PDGFRA, POLD1, and POLE (sequencing only); EPCAM and GREM1 (deletion/duplication only). DNA and RNA analyses performed for * genes.    06/22/2022 - 07/20/2022 Radiation Therapy   Site Technique Total Dose (Gy) Dose per Fx (Gy) Completed Fx Beam Energies  Breast, Right: Breast_R 3D 28.5/28.5 5.7 5/5 10X     07/06/2022 -  Anti-estrogen oral therapy   Anastrozole    INTERVAL HISTORY:   She is taking anastrozole daily most of the times, may have missed a couple doses. Since last visit, she lost her husband after prolonged hospitalization. She is understandably distraught by the loss.  She denies any hot flashes. She is still using her inhalers for asthma. She rarely notices pain in the right breast, but no masses. Rest of the pertinent 10 point ROS reviewed and neg.  REVIEW OF SYSTEMS:  Review of Systems  Constitutional:  Negative for appetite change, chills, fatigue, fever and unexpected weight change.  HENT:   Negative for hearing loss, lump/mass and trouble swallowing.   Eyes:  Negative for eye problems and icterus.  Respiratory:  Negative for chest tightness, cough and shortness of breath.   Cardiovascular:  Negative for chest pain, leg swelling and palpitations.  Gastrointestinal:  Negative for abdominal distention, abdominal pain, constipation, diarrhea, nausea and vomiting.  Endocrine: Negative for hot flashes.  Genitourinary:  Negative for difficulty urinating.   Musculoskeletal:  Negative for arthralgias.  Skin:  Negative for itching and rash.  Neurological:  Negative for dizziness, extremity weakness, headaches and numbness.  Hematological:  Negative for adenopathy. Does not bruise/bleed easily.   Psychiatric/Behavioral:  Negative for depression. The patient is not nervous/anxious.   Breast: Denies any new nodularity, masses, tenderness, nipple changes, or nipple discharge.      ONCOLOGY TREATMENT TEAM:  1. Surgeon:  Dr. Ninfa Linden at [facility] 2. Medical Oncologist: Dr. Chryl Heck  3. Radiation Oncologist: Dr. Isidore Moos    PAST MEDICAL/SURGICAL HISTORY:  Past Medical History:  Diagnosis Date   ALLERGIC RHINITIS 02/17/2010   Allergy    ASTHMA 02/17/2010   Asthma    Family history of breast cancer    Family history of kidney cancer    Family history of prostate cancer    Family history of stomach cancer    GERD 02/17/2010   Glaucoma    GOITER, MULTINODULAR 02/17/2010   HYPERGLYCEMIA 02/22/2011   HYPERTENSION 02/17/2010   IBS 02/17/2010   Osteopenia 11/2018   T score -1.9 FRAX 7.9% / 1.4%   Retina hole    Unspecified disorder of liver 02/22/2011   Past Surgical History:  Procedure Laterality Date   ABDOMINAL HYSTERECTOMY  1989   Leiomyoma   Bladder Bx  2009   BREAST EXCISIONAL BIOPSY Left 1999   BREAST LUMPECTOMY WITH RADIOACTIVE SEED LOCALIZATION Right 04/28/2022   Procedure: RIGHT BREAST LUMPECTOMY WITH RADIOACTIVE SEED LOCALIZATION;  Surgeon: Coralie Keens, MD;  Location: [facility];  Service: General;  Laterality: Right;  LMA   BREAST SURGERY     Breast cyst   CHOLECYSTECTOMY     EYE SURGERY     EYE SURGERY     Hole in Retina     NOSE SURGERY       ALLERGIES:  Allergies  Allergen Reactions   Other     Other reaction(s): Other (See Comments) bacitacin-races heart   Augmentin [Amoxicillin-Pot Clavulanate]    Clarithromycin    Diltiazem Hcl Other (See Comments)   Terazosin Other (See Comments) and Swelling   Bactrim [Sulfamethoxazole-Trimethoprim] Palpitations     CURRENT MEDICATIONS:  Outpatient Encounter Medications as of 01/06/2023  Medication Sig   albuterol (VENTOLIN HFA) 108 (90 Base) MCG/ACT inhaler Inhale 2  puffs into the lungs as needed.     amLODipine (NORVASC) 5 MG tablet Take 5 mg by mouth daily.     anastrozole (ARIMIDEX) 1 MG tablet Take 1 tablet (1 mg total) by mouth daily.   budesonide-formoterol (SYMBICORT) 160-4.5 MCG/ACT inhaler Inhale 2 puffs into the lungs 2 (two) times daily.   fluticasone (FLONASE) 50 MCG/ACT nasal spray Place 2 sprays into the nose as needed.     latanoprost (XALATAN) 0.005 % ophthalmic solution 1 drop at bedtime.   omeprazole (PRILOSEC) 20 MG capsule Take 20 mg by mouth daily as needed.   potassium chloride SA (K-DUR,KLOR-CON) 20 MEQ tablet Take 20 mEq by mouth daily.     timolol (TIMOPTIC) 0.5 % ophthalmic solution    traMADol (ULTRAM) 50 MG tablet Take 1 tablet (50 mg total) by mouth every 6 (six) hours as needed for moderate pain or severe pain.   valsartan-hydrochlorothiazide (DIOVAN-HCT) 160-25 MG per tablet Take 1 tablet by mouth daily.     No facility-administered encounter medications on file as of 01/06/2023.     ONCOLOGIC FAMILY HISTORY:  Family History  Problem Relation Age of Onset   Heart attack Mother        at age of 49 died in her sleep "massive heart attack"   Goiter Mother        benign  Hypertension Father    Heart disease Father    Hypertension Sister    Breast cancer Sister 22   Hypertension Brother    Lung cancer Brother    Cancer Brother        Peritoneal cancer   Heart disease Maternal Grandmother    Heart disease Maternal Grandfather    Heart disease Paternal Grandmother    Heart disease Paternal Grandfather    Breast cancer Maternal Aunt    Prostate cancer Maternal Uncle        4 maternal uncles   Lung cancer Maternal Uncle    Stomach cancer Paternal Aunt    Liver cancer Paternal Aunt    Kidney cancer Paternal Uncle    Stomach cancer Cousin        maternal cousin   Stomach cancer Other        MGMs sister   Diabetes Neg Hx       SOCIAL HISTORY:  Social History   Socioeconomic History   Marital status:  Married    Spouse name: Not on file   Number of children: Not on file   Years of education: Not on file   Highest education level: Not on file  Occupational History   Occupation: Administration    Employer: [employer]  Tobacco Use   Smoking status: Never   Smokeless tobacco: Never  Vaping Use   Vaping Use: Never used  Substance and Sexual Activity   Alcohol use: Never   Drug use: No   Sexual activity: Not Currently    Birth control/protection: Surgical    Comment: 1st intercourse 77 yo-Fewer than 5 partners  Other Topics Concern   Not on file  Social History Narrative   Not on file   Social Determinants of Health   Financial Resource Strain: Not on file  Food Insecurity: No Food Insecurity (04/22/2022)   Hunger Vital Sign    Worried About Running Out of Food in the Last Year: Never true    Ran Out of Food in the Last Year: Never true  Transportation Needs: No Transportation Needs (04/22/2022)   PRAPARE - Hydrologist (Medical): No    Lack of Transportation (Non-Medical): No  Physical Activity: Not on file  Stress: Not on file  Social Connections: Not on file  Intimate Partner Violence: Not on file     OBSERVATIONS/OBJECTIVE:  BP 139/74 (BP Location: Left Arm, Patient Position: Sitting)   Pulse 60   Temp (!) 97.5 F (36.4 C) (Oral)   Resp 18   Wt 178 lb 1 oz (80.8 kg)   SpO2 94%   BMI 31.54 kg/m  GENERAL: Patient is a well appearing female in no acute distress HEENT:  Sclerae anicteric.  Oropharynx clear and moist. No ulcerations or evidence of oropharyngeal candidiasis. Neck is supple.  NODES:  No cervical, supraclavicular, or axillary lymphadenopathy palpated.  BREAST EXAM: Right breast status postlumpectomy and radiation no sign of local recurrence left breast benign. EXTREMITIES:  No peripheral edema.   SKIN:  Clear with no obvious rashes or skin changes. No nail dyscrasia. NEURO:  Nonfocal. Well oriented.  Appropriate  affect.  LABORATORY DATA:  None for this visit.  DIAGNOSTIC IMAGING:  None for this visit.      ASSESSMENT AND PLAN:  Ms.. Racicot is a pleasant 77 y.o. female with Stage 1A right breast invasive ductal carcinoma, ER+/PR+/HER2-, diagnosed in 04/2022, treated with lumpectomy, adjuvant radiation therapy, and anti-estrogen therapy with anastrozole beginning  in July 2023.  [facility] for our initial meeting and routine follow-up post-completion of treatment for breast cancer.   1. Stage IA right breast cancer:  Ms. Kady is continuing to recover from definitive treatment for breast cancer. She is tolerating anastrozole well without any complaints. No concerns for breast cancer recurrence on exam Mammogram due in March 2024 Bone density 12/2020, will need regular exercise, Vit D/ calcium supplementation. -Bone Density with Dr. Dellis Filbert in 12/2022 - RTC in clinic with [place] in about 6 months.  The patient was provided an opportunity to ask questions and all were answered. The patient agreed with the plan and demonstrated an understanding of the instructions.   Total encounter time:30 minutes*in face-to-face visit time, chart review, lab review, care coordination, order entry, and documentation of the encounter time.  *Total Encounter Time as defined by the Centers for Medicare and Medicaid Services includes, in addition to the face-to-face time of a patient visit (documented in the note above) non-face-to-face time: obtaining and reviewing outside history, ordering and reviewing medications, tests or procedures, care coordination (communications with other health care professionals or caregivers) and documentation in the medical record.

## 2023-03-03 DIAGNOSIS — H401132 Primary open-angle glaucoma, bilateral, moderate stage: Secondary | ICD-10-CM | POA: Diagnosis not present

## 2023-03-24 DIAGNOSIS — H401122 Primary open-angle glaucoma, left eye, moderate stage: Secondary | ICD-10-CM | POA: Diagnosis not present

## 2023-03-29 ENCOUNTER — Other Ambulatory Visit: Payer: Self-pay | Admitting: Internal Medicine

## 2023-03-29 DIAGNOSIS — R109 Unspecified abdominal pain: Secondary | ICD-10-CM

## 2023-03-29 DIAGNOSIS — C50911 Malignant neoplasm of unspecified site of right female breast: Secondary | ICD-10-CM | POA: Diagnosis not present

## 2023-03-29 DIAGNOSIS — I10 Essential (primary) hypertension: Secondary | ICD-10-CM | POA: Diagnosis not present

## 2023-03-29 DIAGNOSIS — J4520 Mild intermittent asthma, uncomplicated: Secondary | ICD-10-CM | POA: Diagnosis not present

## 2023-03-29 DIAGNOSIS — K219 Gastro-esophageal reflux disease without esophagitis: Secondary | ICD-10-CM | POA: Diagnosis not present

## 2023-04-01 ENCOUNTER — Ambulatory Visit
Admission: RE | Admit: 2023-04-01 | Discharge: 2023-04-01 | Disposition: A | Discharge status: Home / Self Care | Payer: Medicare Other | Source: Ambulatory Visit | Attending: Internal Medicine | Admitting: Internal Medicine

## 2023-04-01 ENCOUNTER — Other Ambulatory Visit: Payer: Self-pay | Admitting: Internal Medicine

## 2023-04-01 ENCOUNTER — Encounter (HOSPITAL_COMMUNITY): Payer: Self-pay

## 2023-04-01 ENCOUNTER — Ambulatory Visit (HOSPITAL_COMMUNITY): Payer: Medicare Other

## 2023-04-01 DIAGNOSIS — Z853 Personal history of malignant neoplasm of breast: Secondary | ICD-10-CM | POA: Diagnosis not present

## 2023-04-01 DIAGNOSIS — R109 Unspecified abdominal pain: Secondary | ICD-10-CM

## 2023-04-01 DIAGNOSIS — R140 Abdominal distension (gaseous): Secondary | ICD-10-CM | POA: Diagnosis not present

## 2023-04-01 DIAGNOSIS — I700 Atherosclerosis of aorta: Secondary | ICD-10-CM | POA: Diagnosis not present

## 2023-04-08 ENCOUNTER — Ambulatory Visit: Payer: Medicare Other | Admitting: Podiatry

## 2023-04-08 DIAGNOSIS — R252 Cramp and spasm: Secondary | ICD-10-CM

## 2023-04-08 MED ORDER — GABAPENTIN 100 MG PO CAPS: 100.0000 mg | ORAL_CAPSULE | Freq: Every day | ORAL | 1 refills | Status: DC

## 2023-04-08 NOTE — Progress Notes (Signed)
   Chief Complaint  Patient presents with   Foot Pain    Patient came in today for tightness on the bottom of her feet bilaterally, rate of pain 8 out of 10,     HPI: 77 y.o. female presenting today for new complaint of 'tightness' to the bilateral feet especially at night when she goes to bed.  Patient states that night she experiences a tight sensation.  This happens almost every night when she goes to bed.  Ongoing for several months  Past Medical History:  Diagnosis Date   ALLERGIC RHINITIS 02/17/2010   Allergy    ASTHMA 02/17/2010   Asthma    Family history of breast cancer    Family history of kidney cancer    Family history of prostate cancer    Family history of stomach cancer    GERD 02/17/2010   Glaucoma    GOITER, MULTINODULAR 02/17/2010   HYPERGLYCEMIA 02/22/2011   HYPERTENSION 02/17/2010   IBS 02/17/2010   Osteopenia 11/2018   T score -1.9 FRAX 7.9% / 1.4%   Retina hole    Unspecified disorder of liver 02/22/2011    Past Surgical History:  Procedure Laterality Date   ABDOMINAL HYSTERECTOMY  1989   Leiomyoma   Bladder Bx  2009   BREAST EXCISIONAL BIOPSY Left 1999   BREAST LUMPECTOMY WITH RADIOACTIVE SEED LOCALIZATION Right 04/28/2022   Procedure: RIGHT BREAST LUMPECTOMY WITH RADIOACTIVE SEED LOCALIZATION;  Surgeon: Abigail Miyamoto, MD;  Location: [facility];  Service: General;  Laterality: Right;  LMA   BREAST SURGERY     Breast cyst   CHOLECYSTECTOMY     EYE SURGERY     EYE SURGERY     Hole in Retina     NOSE SURGERY      Allergies  Allergen Reactions   Other     Other reaction(s): Other (See Comments) bacitacin-races heart   Augmentin [Amoxicillin-Pot Clavulanate]    Clarithromycin    Diltiazem Hcl Other (See Comments)   Terazosin Other (See Comments) and Swelling   Bactrim [Sulfamethoxazole-Trimethoprim] Palpitations     Physical Exam: General: The patient is alert and oriented x3 in no acute distress.  Dermatology: Skin is  warm, dry and supple bilateral lower extremities.   Vascular: Palpable pedal pulses bilaterally. Capillary refill within normal limits.  Foot is well-perfused  Neurological: Grossly intact via light touch  Musculoskeletal Exam: No pedal deformities noted   Assessment/Plan of Care: 1.  Idiopathic nocturnal foot pain and leg pain "tightness"  -Prescription for gabapentin 100 mg nightly to see if this helps alleviate some of her symptoms at night -Continue compression socks throughout the day -Return to clinic as needed     Felecia Shelling, DPM [facility]  Dr. Felecia Shelling, DPM    [address] [place], [place] [address]                Office [phone]  Fax [phone]

## 2023-05-03 ENCOUNTER — Ambulatory Visit
Admission: RE | Admit: 2023-05-03 | Discharge: 2023-05-03 | Disposition: A | Discharge status: Home / Self Care | Payer: Medicare Other | Source: Ambulatory Visit | Attending: Adult Health | Admitting: Adult Health

## 2023-05-03 DIAGNOSIS — Z853 Personal history of malignant neoplasm of breast: Secondary | ICD-10-CM | POA: Diagnosis not present

## 2023-05-03 DIAGNOSIS — C50211 Malignant neoplasm of upper-inner quadrant of right female breast: Secondary | ICD-10-CM

## 2023-05-03 DIAGNOSIS — R92323 Mammographic fibroglandular density, bilateral breasts: Secondary | ICD-10-CM | POA: Diagnosis not present

## 2023-05-03 HISTORY — DX: Personal history of irradiation: Z92.3

## 2023-05-05 DIAGNOSIS — H401133 Primary open-angle glaucoma, bilateral, severe stage: Secondary | ICD-10-CM | POA: Diagnosis not present

## 2023-05-25 DIAGNOSIS — H401134 Primary open-angle glaucoma, bilateral, indeterminate stage: Secondary | ICD-10-CM | POA: Diagnosis not present

## 2023-07-07 ENCOUNTER — Inpatient Hospital Stay: Payer: Medicare Other | Admitting: Hematology and Oncology

## 2023-07-14 ENCOUNTER — Telehealth: Payer: Self-pay | Admitting: Hematology and Oncology

## 2023-07-14 NOTE — Telephone Encounter (Signed)
Patient called to reschedule missed appointment; patient is aware of appointment times/dates

## 2023-07-22 ENCOUNTER — Encounter (HOSPITAL_COMMUNITY): Payer: Self-pay

## 2023-07-22 ENCOUNTER — Ambulatory Visit (HOSPITAL_COMMUNITY)
Admission: EM | Admit: 2023-07-22 | Discharge: 2023-07-22 | Disposition: A | Discharge status: Home / Self Care | Payer: Medicare Other | Attending: Physician Assistant | Admitting: Physician Assistant

## 2023-07-22 ENCOUNTER — Ambulatory Visit (INDEPENDENT_AMBULATORY_CARE_PROVIDER_SITE_OTHER): Payer: Medicare Other

## 2023-07-22 DIAGNOSIS — J329 Chronic sinusitis, unspecified: Secondary | ICD-10-CM | POA: Diagnosis not present

## 2023-07-22 DIAGNOSIS — R0989 Other specified symptoms and signs involving the circulatory and respiratory systems: Secondary | ICD-10-CM | POA: Diagnosis not present

## 2023-07-22 DIAGNOSIS — J40 Bronchitis, not specified as acute or chronic: Secondary | ICD-10-CM

## 2023-07-22 DIAGNOSIS — R059 Cough, unspecified: Secondary | ICD-10-CM

## 2023-07-22 LAB — POCT URINALYSIS DIP (MANUAL ENTRY)
Bilirubin, UA: NEGATIVE
Blood, UA: NEGATIVE
Glucose, UA: NEGATIVE mg/dL
Leukocytes, UA: NEGATIVE
Nitrite, UA: NEGATIVE
Spec Grav, UA: 1.025 (ref 1.010–1.025)
Urobilinogen, UA: 0.2 E.U./dL
pH, UA: 7 (ref 5.0–8.0)

## 2023-07-22 MED ORDER — DOXYCYCLINE HYCLATE 100 MG PO CAPS: 100.0000 mg | ORAL_CAPSULE | Freq: Two times a day (BID) | ORAL | 0 refills | Status: DC

## 2023-07-22 NOTE — ED Triage Notes (Signed)
Pt c/o chest congestion and loss of taste/smell x2 months.  Pt c/o bladder pressure and abdominal pain x2 months. States has a GI appt in October.

## 2023-07-22 NOTE — ED Provider Notes (Signed)
[facility]    CSN: [id number] Arrival date & time: 07/22/23  1332      History   Chief Complaint Chief Complaint  Patient presents with   Nasal Congestion    HPI Rachel Barrett is a 77 y.o. female.   Patient presents today with a several month history of multiple symptoms.  She reports ongoing congestion, postnasal drainage, hoarseness, loss of taste/smell.  Denies any significant shortness of breath, chest pain, nausea, vomiting, diarrhea.  She does report some urinary symptoms including intermittent lower abdominal pain and frequency.  Denies any dysuria, hematuria, pelvic pain, vaginal symptoms.  She has tried multiple cold and flu medications without improvement of symptoms.  Denies any recent antibiotics.  She has not taken a COVID test.  She does have a history of breast cancer last year but completed course of radiation.  She is not currently receiving hemotherapy.  She does have a history of asthma managed with albuterol alone.  Reports that she was told to stop her Symbicort by ophthalmology due to her diagnosis of glaucoma.  She has not felt that she has needed her albuterol more frequently recently.  She denies any recent antibiotics.  She is eating and drinking normally.    Past Medical History:  Diagnosis Date   ALLERGIC RHINITIS 02/17/2010   Allergy    ASTHMA 02/17/2010   Asthma    Family history of breast cancer    Family history of kidney cancer    Family history of prostate cancer    Family history of stomach cancer    GERD 02/17/2010   Glaucoma    GOITER, MULTINODULAR 02/17/2010   HYPERGLYCEMIA 02/22/2011   HYPERTENSION 02/17/2010   IBS 02/17/2010   Osteopenia 11/2018   T score -1.9 FRAX 7.9% / 1.4%   Personal history of radiation therapy    Retina hole    Unspecified disorder of liver 02/22/2011    Patient Active Problem List   Diagnosis Date Noted   Genetic testing 05/06/2022   Family history of breast cancer 04/19/2022   Family history  of prostate cancer 04/19/2022   Family history of stomach cancer 04/19/2022   Family history of kidney cancer 04/19/2022   Malignant neoplasm of upper-inner quadrant of right breast in female, estrogen receptor positive (HCC) 04/15/2022   Body mass index (BMI) 34.0-34.9, adult 04/13/2022   Hypercalcemia 04/13/2022   Mild persistent asthma, uncomplicated 04/13/2022   Ovarian cyst 04/13/2022   Anxiety state 10/28/2020   Big thyroid 10/28/2020   Intermediate uveitis, bilateral 06/11/2014   Asthma 09/28/2012   Anterior uveitis 07/18/2012   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   Primary open angle glaucoma 07/18/2012   Retina hole 07/18/2012   Fibroid    Osteopenia    UNSPECIFIED DISORDER OF LIVER 02/22/2011   HYPERGLYCEMIA 02/22/2011   GOITER, MULTINODULAR 02/17/2010   HYPERTENSION 02/17/2010   ALLERGIC RHINITIS 02/17/2010   ASTHMA 02/17/2010   GERD 02/17/2010   IBS 02/17/2010    Past Surgical History:  Procedure Laterality Date   ABDOMINAL HYSTERECTOMY  1989   Leiomyoma   Bladder Bx  2009   BREAST EXCISIONAL BIOPSY Left 1999   BREAST LUMPECTOMY WITH RADIOACTIVE SEED LOCALIZATION Right 04/28/2022   Procedure: RIGHT BREAST LUMPECTOMY WITH RADIOACTIVE SEED LOCALIZATION;  Surgeon: Abigail Miyamoto, MD;  Location: [facility];  Service: General;  Laterality: Right;  LMA   BREAST SURGERY     Breast cyst   CHOLECYSTECTOMY     EYE SURGERY  EYE SURGERY     Hole in Retina     NOSE SURGERY      OB History     Gravida  2   Para  1   Term  1   Preterm      AB  1   Living  1      SAB  1   IAB      Ectopic      Multiple      Live Births               Home Medications    Prior to Admission medications   Medication Sig Start Date End Date Taking? Authorizing Provider  doxycycline (VIBRAMYCIN) 100 MG capsule Take 1 capsule (100 mg total) by mouth 2 (two) times daily. 07/22/23  Yes ,  K, PA-C  albuterol (VENTOLIN HFA) 108 (90 Base)  MCG/ACT inhaler Inhale 2 puffs into the lungs as needed.      [provider]  amLODipine (NORVASC) 5 MG tablet Take 5 mg by mouth daily.      [provider]  anastrozole (ARIMIDEX) 1 MG tablet Take 1 tablet (1 mg total) by mouth daily. 07/06/22   Rachel Moulds, MD  budesonide-formoterol (SYMBICORT) 160-4.5 MCG/ACT inhaler Inhale 2 puffs into the lungs 2 (two) times daily.    [provider]  fluticasone (FLONASE) 50 MCG/ACT nasal spray Place 2 sprays into the nose as needed.      [provider]  gabapentin (NEURONTIN) 100 MG capsule Take 1 capsule (100 mg total) by mouth at bedtime. 04/08/23   Felecia Shelling, DPM  latanoprost (XALATAN) 0.005 % ophthalmic solution 1 drop at bedtime.    [provider]  omeprazole (PRILOSEC) 20 MG capsule Take 20 mg by mouth daily as needed.    [provider]  potassium chloride SA (K-DUR,KLOR-CON) 20 MEQ tablet Take 20 mEq by mouth daily.      [provider]  timolol (TIMOPTIC) 0.5 % ophthalmic solution  08/19/20   [provider]  valsartan-hydrochlorothiazide (DIOVAN-HCT) 160-25 MG per tablet Take 1 tablet by mouth daily.      [provider]    Family History Family History  Problem Relation Age of Onset   Heart attack Mother        at age of 39 died in her sleep "massive heart attack"   Goiter Mother        benign   Hypertension Father    Heart disease Father    Hypertension Sister    Breast cancer Sister 64   Breast cancer Sister 25   Breast cancer Maternal Aunt 61   Prostate cancer Maternal Uncle        4 maternal uncles   Lung cancer Maternal Uncle    Stomach cancer Paternal Aunt    Liver cancer Paternal Aunt    Kidney cancer Paternal Uncle    Heart disease Maternal Grandmother    Heart disease Maternal Grandfather    Heart disease Paternal Grandmother    Heart disease Paternal Grandfather    Stomach cancer Cousin        maternal cousin   Hypertension  Brother    Lung cancer Brother    Cancer Brother        Peritoneal cancer   Stomach cancer Other        MGMs sister   Diabetes Neg Hx     Social History Social History   Tobacco Use  Smoking status: Never   Smokeless tobacco: Never  Vaping Use   Vaping status: Never Used  Substance Use Topics   Alcohol use: Never   Drug use: No     Allergies   Other, Augmentin [amoxicillin-pot clavulanate], Clarithromycin, Diltiazem hcl, Terazosin, and Bactrim [sulfamethoxazole-trimethoprim]   Review of Systems Review of Systems  Constitutional:  Positive for activity change. Negative for appetite change, fatigue and fever.  HENT:  Positive for congestion, postnasal drip, sore throat and voice change. Negative for sinus pressure, sneezing and trouble swallowing.   Respiratory:  Positive for cough. Negative for shortness of breath.   Cardiovascular:  Negative for chest pain.  Gastrointestinal:  Positive for abdominal pain. Negative for diarrhea, nausea and vomiting.  Genitourinary:  Positive for frequency. Negative for dysuria and urgency.     Physical Exam Triage Vital Signs ED Triage Vitals  Encounter Vitals Group     BP 07/22/23 1426 (!) 164/83     Systolic BP Percentile --      Diastolic BP Percentile --      Pulse Rate 07/22/23 1426 64     Resp 07/22/23 1426 18     Temp 07/22/23 1426 98.3 F (36.8 C)     Temp Source 07/22/23 1426 Oral     SpO2 07/22/23 1426 97 %     Weight --      Height --      Head Circumference --      Peak Flow --      Pain Score 07/22/23 1427 5     Pain Loc --      Pain Education --      Exclude from Growth Chart --    No data found.  Updated Vital Signs BP (!) 164/83 (BP Location: Left Arm)   Pulse 64   Temp 98.3 F (36.8 C) (Oral)   Resp 18   SpO2 97%   Visual Acuity Right Eye Distance:   Left Eye Distance:   Bilateral Distance:    Right Eye Near:   Left Eye Near:    Bilateral Near:     Physical Exam Vitals reviewed.   Constitutional:      General: She is awake. She is not in acute distress.    Appearance: Normal appearance. She is well-developed. She is not ill-appearing.     Comments: Very pleasant female appears stated age in no acute distress sitting comfortably in exam room  HENT:     Head: Normocephalic and atraumatic.     Right Ear: Tympanic membrane, ear canal and external ear normal. Tympanic membrane is not erythematous or bulging.     Left Ear: Tympanic membrane, ear canal and external ear normal. Tympanic membrane is not erythematous or bulging.     Nose:     Right Sinus: Maxillary sinus tenderness and frontal sinus tenderness present.     Left Sinus: Maxillary sinus tenderness and frontal sinus tenderness present.     Mouth/Throat:     Pharynx: Uvula midline. Postnasal drip present. No oropharyngeal exudate or posterior oropharyngeal erythema.  Cardiovascular:     Rate and Rhythm: Normal rate and regular rhythm.     Heart sounds: Normal heart sounds, S1 normal and S2 normal. No murmur heard. Pulmonary:     Effort: Pulmonary effort is normal.     Breath sounds: Normal breath sounds. No wheezing, rhonchi or rales.     Comments: Clear to auscultation bilaterally Psychiatric:        Behavior: Behavior is cooperative.  UC Treatments / Results  Labs (all labs ordered are listed, but only abnormal results are displayed) Labs Reviewed  POCT URINALYSIS DIP (MANUAL ENTRY) - Abnormal; Notable for the following components:      Result Value   Color, UA straw (*)    Ketones, POC UA small (15) (*)    Protein Ur, POC trace (*)    All other components within normal limits    EKG   Radiology DG Chest 2 View  Result Date: 07/22/2023 CLINICAL DATA:  Cough, congestion and loss of taste and smell for 2 months. EXAM: CHEST - 2 VIEW COMPARISON:  Radiographs 06/11/2012 and 08/06/2011. FINDINGS: The heart size and mediastinal contours are normal. The lungs are clear. There is no pleural  effusion or pneumothorax. No acute osseous findings are identified. Mild degenerative changes in the spine. Surgical clips in the right breast and right upper quadrant of the abdomen. IMPRESSION: No active cardiopulmonary process. Electronically Signed   By: Carey Bullocks M.D.   On: 07/22/2023 15:58    Procedures Procedures (including critical care time)  Medications Ordered in UC Medications - No data to display  Initial Impression / Assessment and Plan / UC Course  I have reviewed the triage vital signs and the nursing notes.  Pertinent labs & imaging results that were available during my care of the patient were reviewed by me and considered in my medical decision making (see chart for details).     Patient is well-appearing, afebrile, nontoxic, nontachycardic.  X-ray was obtained given prolonged cough symptoms that showed no acute cardiopulmonary disease.  I am concerned for sinobronchitis given her ongoing congestion and cough symptoms that have been present for several weeks and are recently worsening.  Will cover with doxycycline given her medication allergies.  We discussed that she should avoid prolonged sun exposure while on this medication due to associated photosensitivity.  She can use over-the-counter medication including Mucinex, Flonase, Tylenol.  She denies any significant shortness of breath or wheezing.  She does have a history of asthma but is not using her albuterol more frequently since her symptoms began.  As result we will not treat for exacerbation and avoid steroids given her history of glaucoma due to concern that this would increase pressures.  She can use over-the-counter medication including Mucinex, Flonase, Tylenol.  Recommended that she rest and drink plenty of fluid.  She did report some urinary symptoms and so UA was obtained but there was no evidence of infection.  She was encouraged to push fluids.  Recommend she follow-up with her primary care.  Discussed that  if anything worsens she should return for reevaluation including chest pain, shortness of breath, worsening cough, fever, nausea, vomiting.  Strict return precautions given.  Final Clinical Impressions(s) / UC Diagnoses   Final diagnoses:  Sinobronchitis     Discharge Instructions      Your x-ray was normal with no evidence of pneumonia.  We are going to treat for a sinus infection.  Start doxycycline 100 mg twice daily for 10 days.  Stay out of the sun while on this medication.  Continue using your allergy medication as well as asthma medication as previously prescribed.  Use Mucinex, Tylenol, Flonase for symptom relief.  Make sure that you rest and drink plenty of fluid.  If your symptoms or not improving within a few days or if anything worsens you should be reevaluated.  Follow-up with your primary care.     ED Prescriptions  Medication Sig Dispense Auth. Provider   doxycycline (VIBRAMYCIN) 100 MG capsule Take 1 capsule (100 mg total) by mouth 2 (two) times daily. 20 capsule , Noberto Retort, PA-C      PDMP not reviewed this encounter.   Jeani Hawking, PA-C 07/22/23 1614

## 2023-07-22 NOTE — Discharge Instructions (Signed)
Your x-ray was normal with no evidence of pneumonia.  We are going to treat for a sinus infection.  Start doxycycline 100 mg twice daily for 10 days.  Stay out of the sun while on this medication.  Continue using your allergy medication as well as asthma medication as previously prescribed.  Use Mucinex, Tylenol, Flonase for symptom relief.  Make sure that you rest and drink plenty of fluid.  If your symptoms or not improving within a few days or if anything worsens you should be reevaluated.  Follow-up with your primary care.

## 2023-07-25 IMAGING — MG MM BREAST BX W/ LOC DEV 1ST LESION IMAGE BX SPEC STEREO GUIDE*R*
8 of 15 series · 8 of 31 positions shown · non-contrast
Comparison: Previous exams.
COMPARISON: Previous exams.

Addendum:
CLINICAL DATA: Patient with indeterminate distortion medial right
breast.

EXAM:
RIGHT BREAST STEREOTACTIC CORE NEEDLE BIOPSY

[R (1 of 8)]
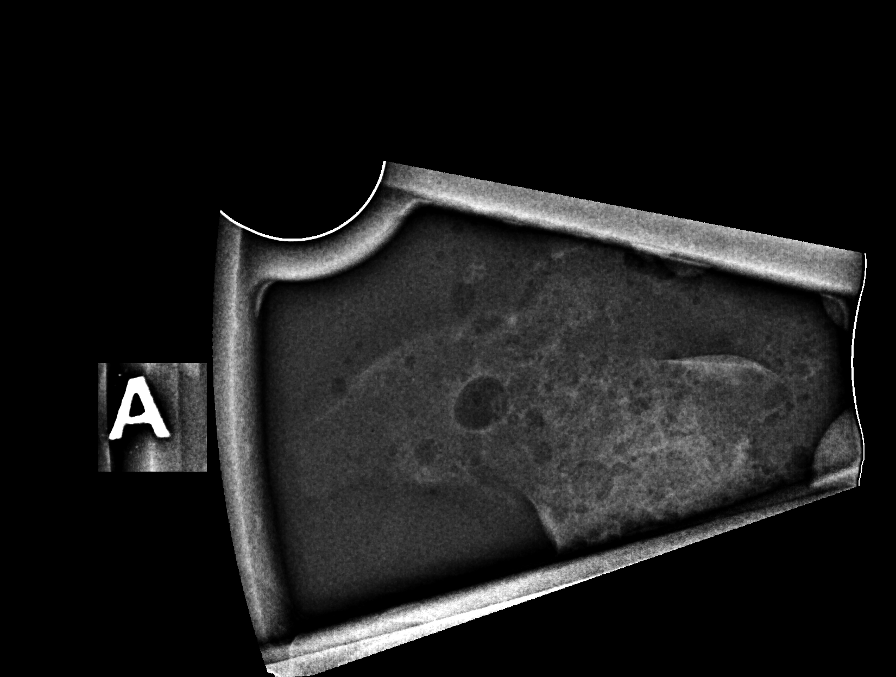

[R (2 of 8)]
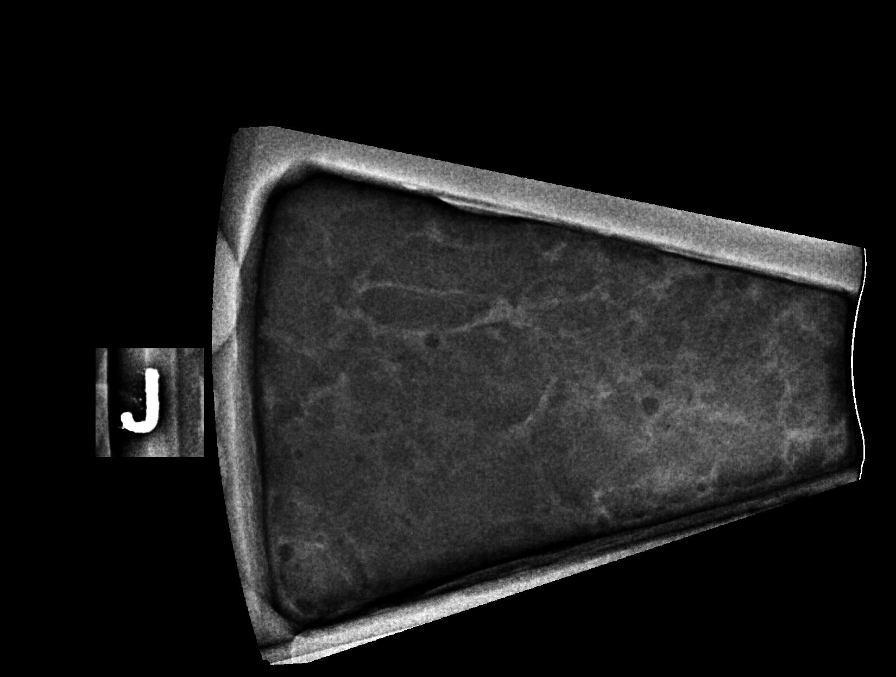

[R (3 of 8)]
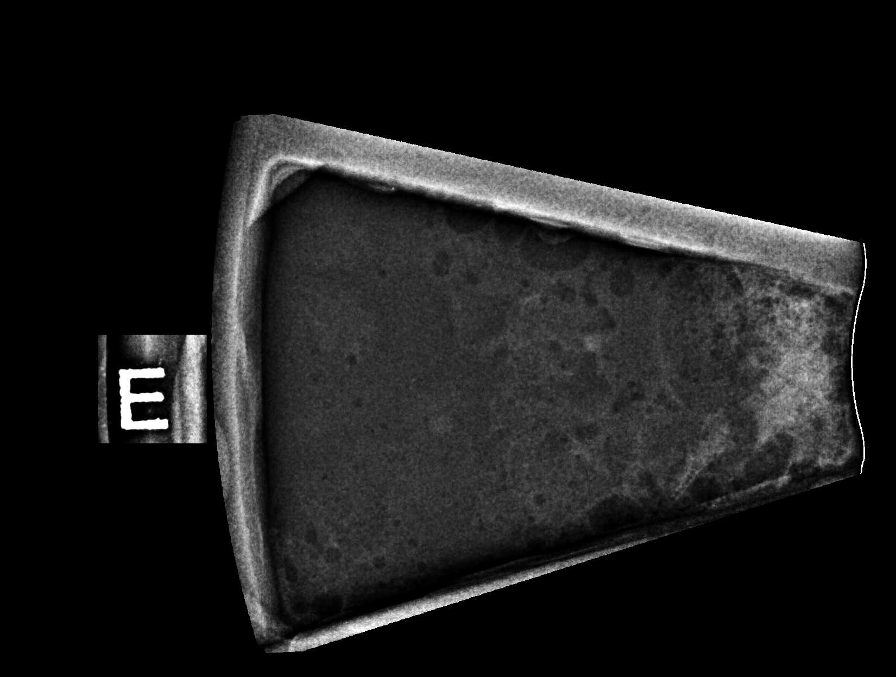

[R (4 of 8)]
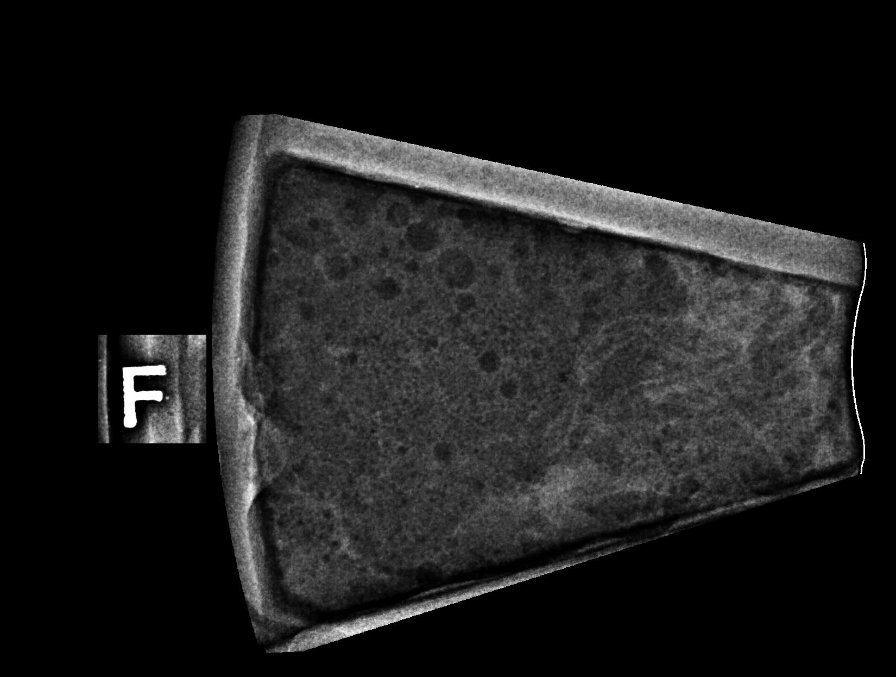

[R (5 of 8)]
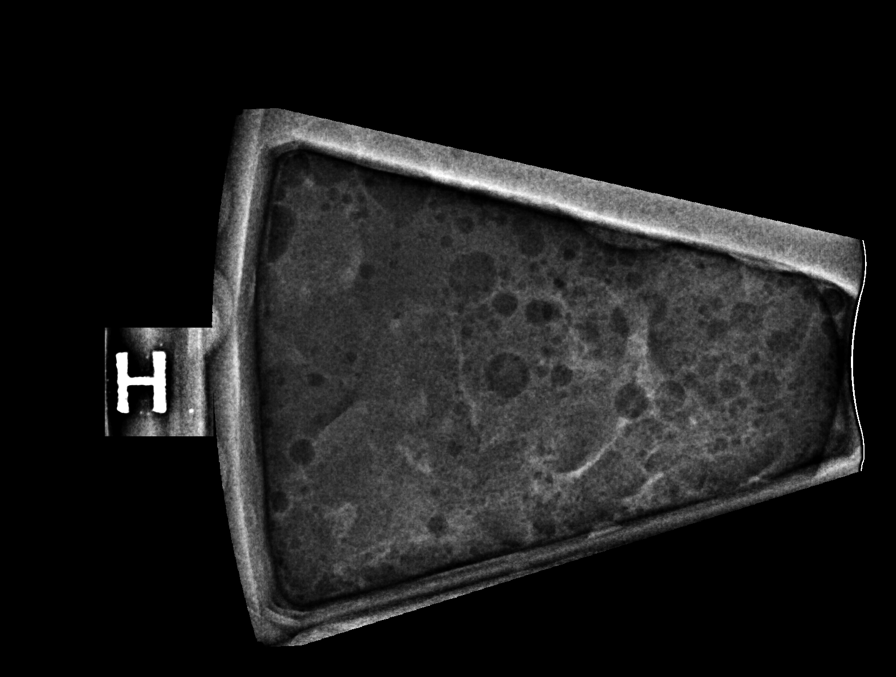

[R (6 of 8)]
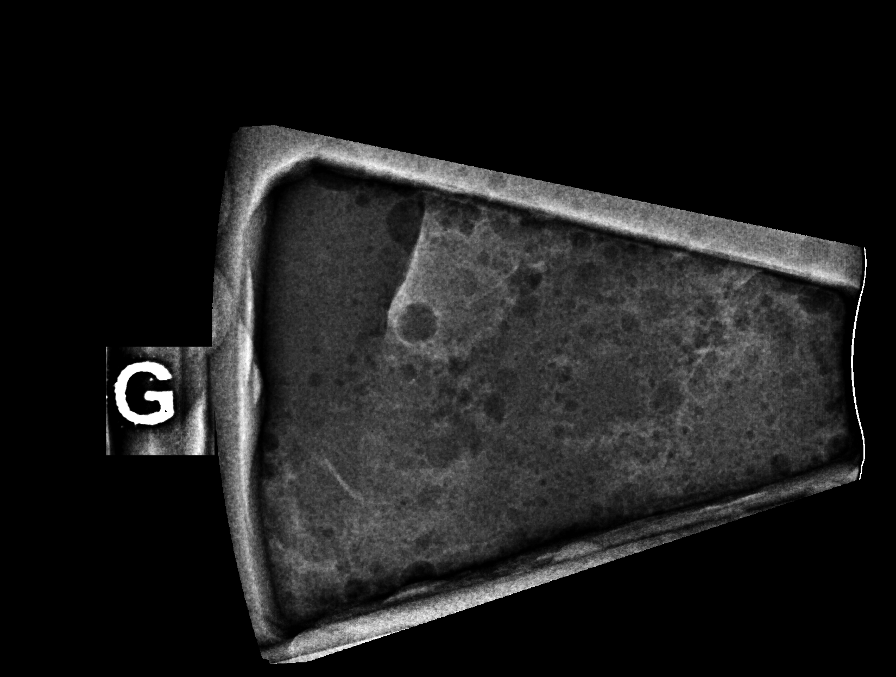

[R (7 of 8)]
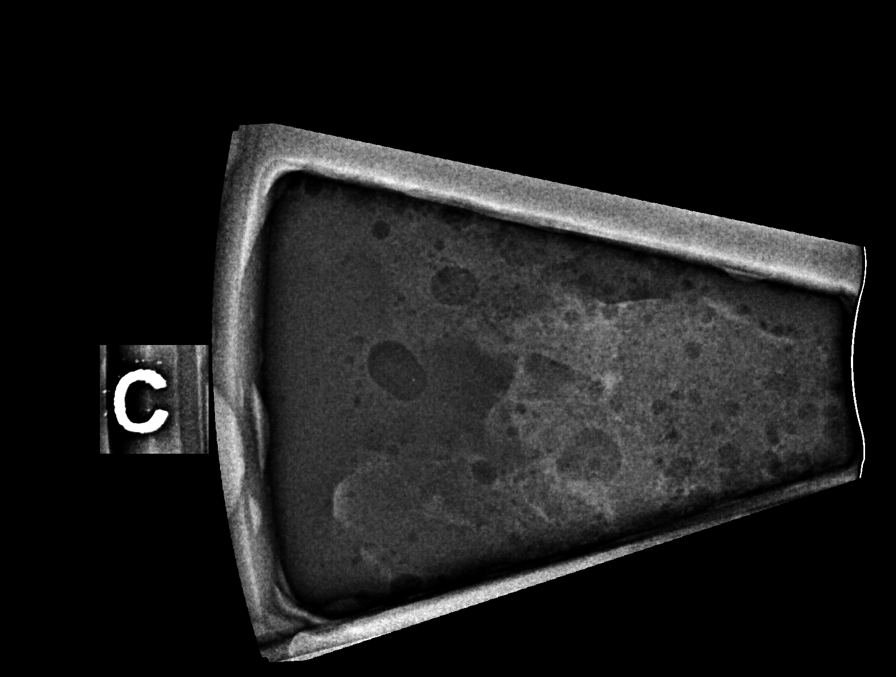

[R (8 of 8)]
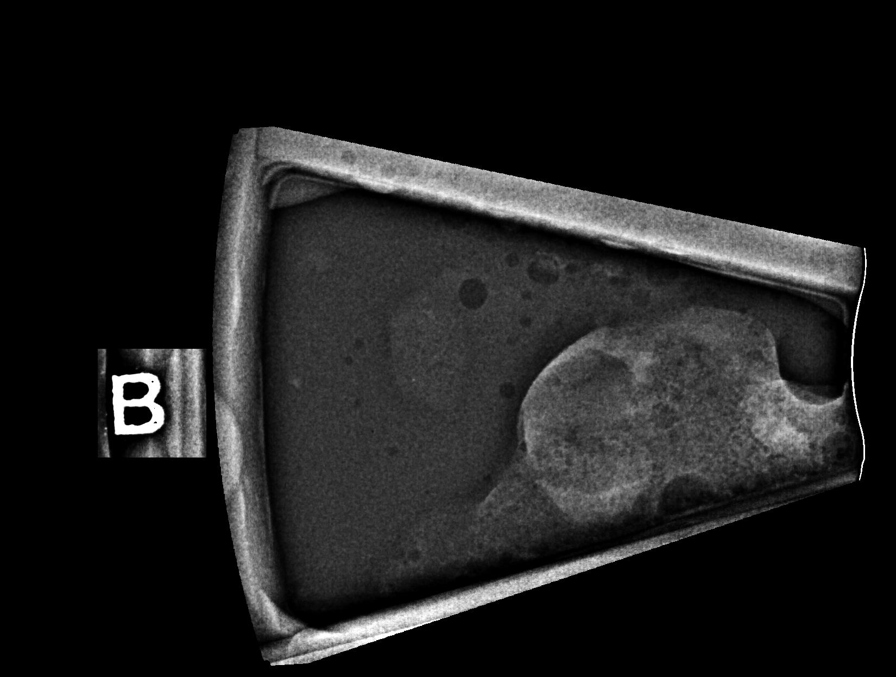

[8 of 31 positions shown; findings below may reference images not displayed]



Using sterile technique and 1% Lidocaine as local anesthetic, under
stereotactic guidance, a 9 gauge vacuum assisted device was used to
perform core needle biopsy of distortion within the medial right
breast using a medial approach.

Lesion quadrant: Upper inner quadrant

At the conclusion of the procedure, coil shaped tissue marker clip
was deployed into the biopsy cavity. Follow-up 2-view mammogram was
performed and dictated separately.
IMPRESSION: Stereotactic-guided biopsy of right breast distortion. No apparent
complications.

ADDENDUM:
Pathology revealed GRADE I INVASIVE DUCTAL CARCINOMA, ATYPICAL
DUCTAL HYPERPLASIA WITH CALCIFICATIONS of the RIGHT breast, medial,
(coil clip). This was found to be concordant by Dr. Serapio Riehl.

Pathology results were discussed with the patient by telephone. The
patient reported doing well after the biopsy with tenderness at the
site. Post biopsy instructions and care were reviewed and questions
were answered. The patient was encouraged to call The [REDACTED] for any additional concerns. My direct phone
number was provided.

Surgical consultation has been arranged with Dr. Haruhisa Bohora at
[REDACTED] on April 13, 2022.

PAthology results reported by Quirijn Amazigh, RN on 04/09/2022.



Using sterile technique and 1% Lidocaine as local anesthetic, under
stereotactic guidance, a 9 gauge vacuum assisted device was used to
perform core needle biopsy of distortion within the medial right
breast using a medial approach.

Lesion quadrant: Upper inner quadrant

At the conclusion of the procedure, coil shaped tissue marker clip
was deployed into the biopsy cavity. Follow-up 2-view mammogram was
performed and dictated separately.
IMPRESSION: Stereotactic-guided biopsy of right breast distortion. No apparent
complications.

## 2023-07-25 IMAGING — MG MM BREAST LOCALIZATION CLIP
4 series · 4 of 12 positions shown · non-contrast
Comparison: Previous exam(s).

CLINICAL DATA: Patient status post stereo biopsy right breast
distortion.

EXAM:
3D DIAGNOSTIC RIGHT MAMMOGRAM POST STEREOTACTIC BIOPSY

[R ML synth-2D]
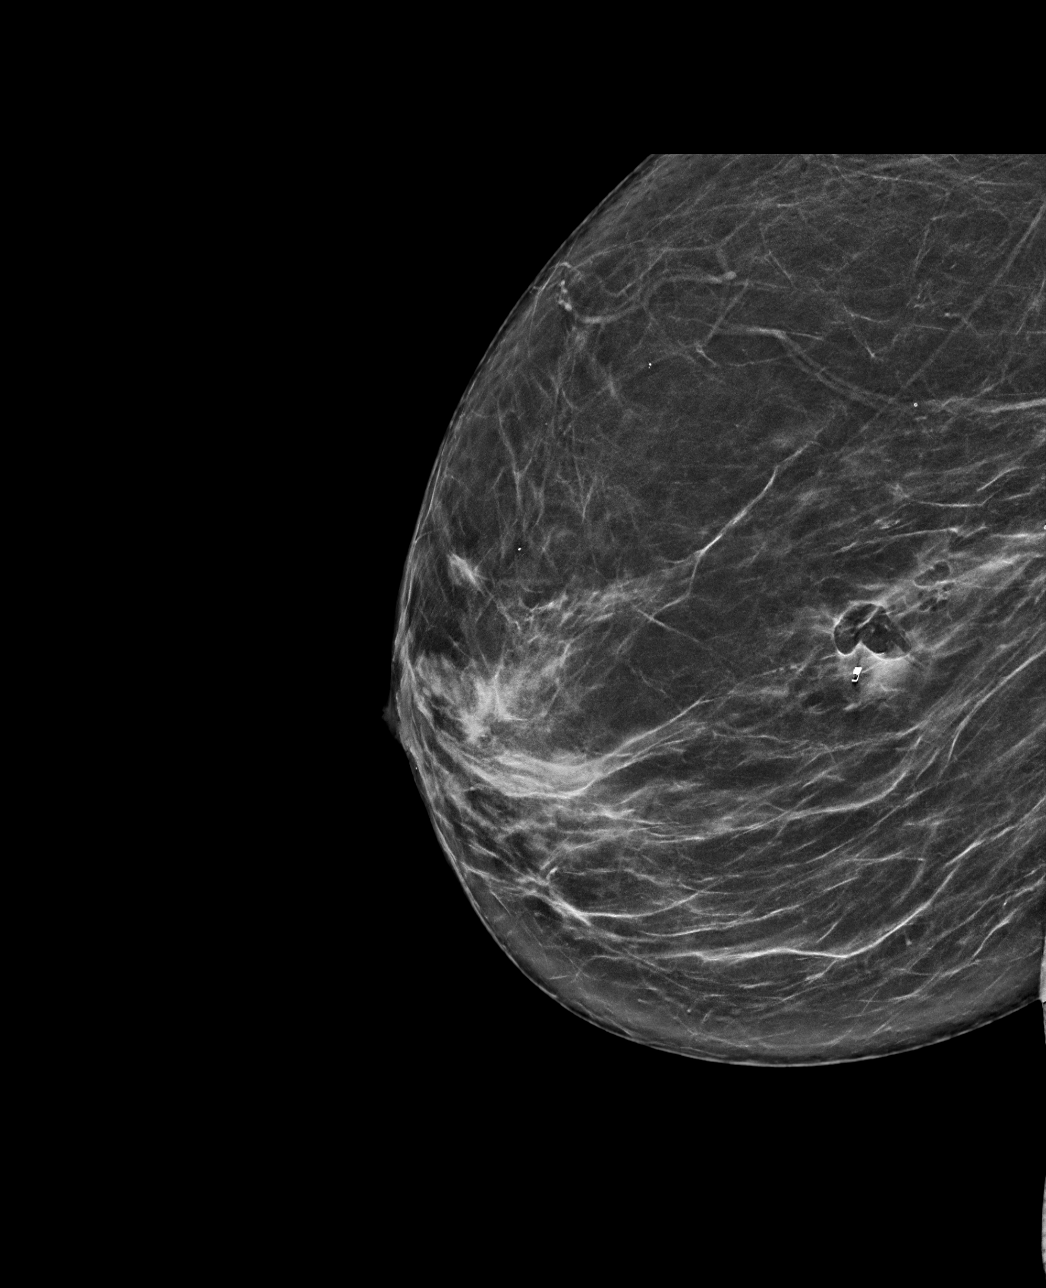

[R CC synth-2D]
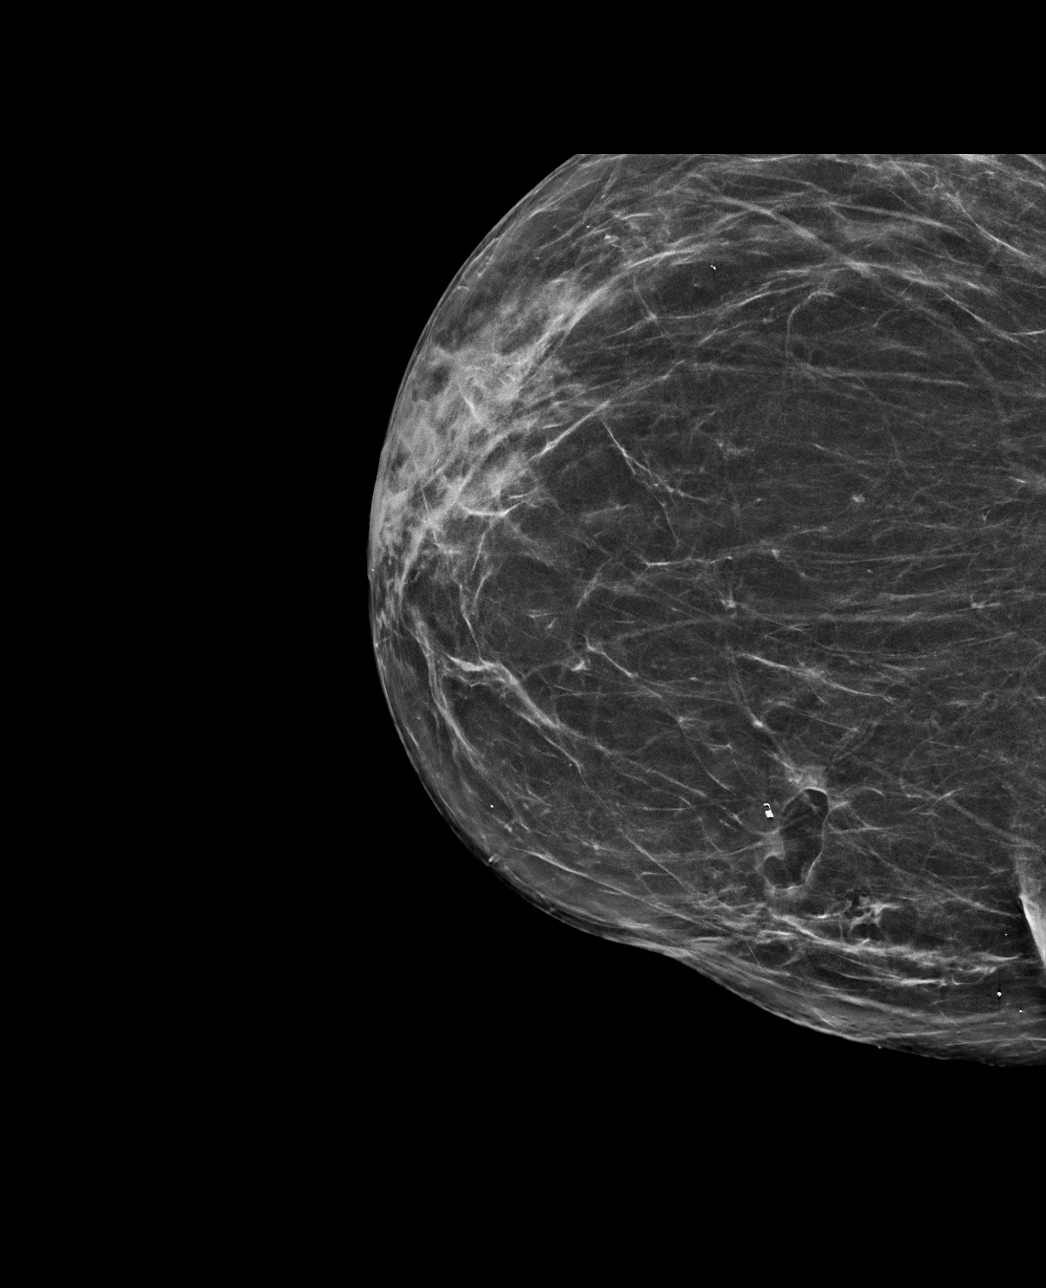

[R CC tomo · tomo slice 35/70.0]
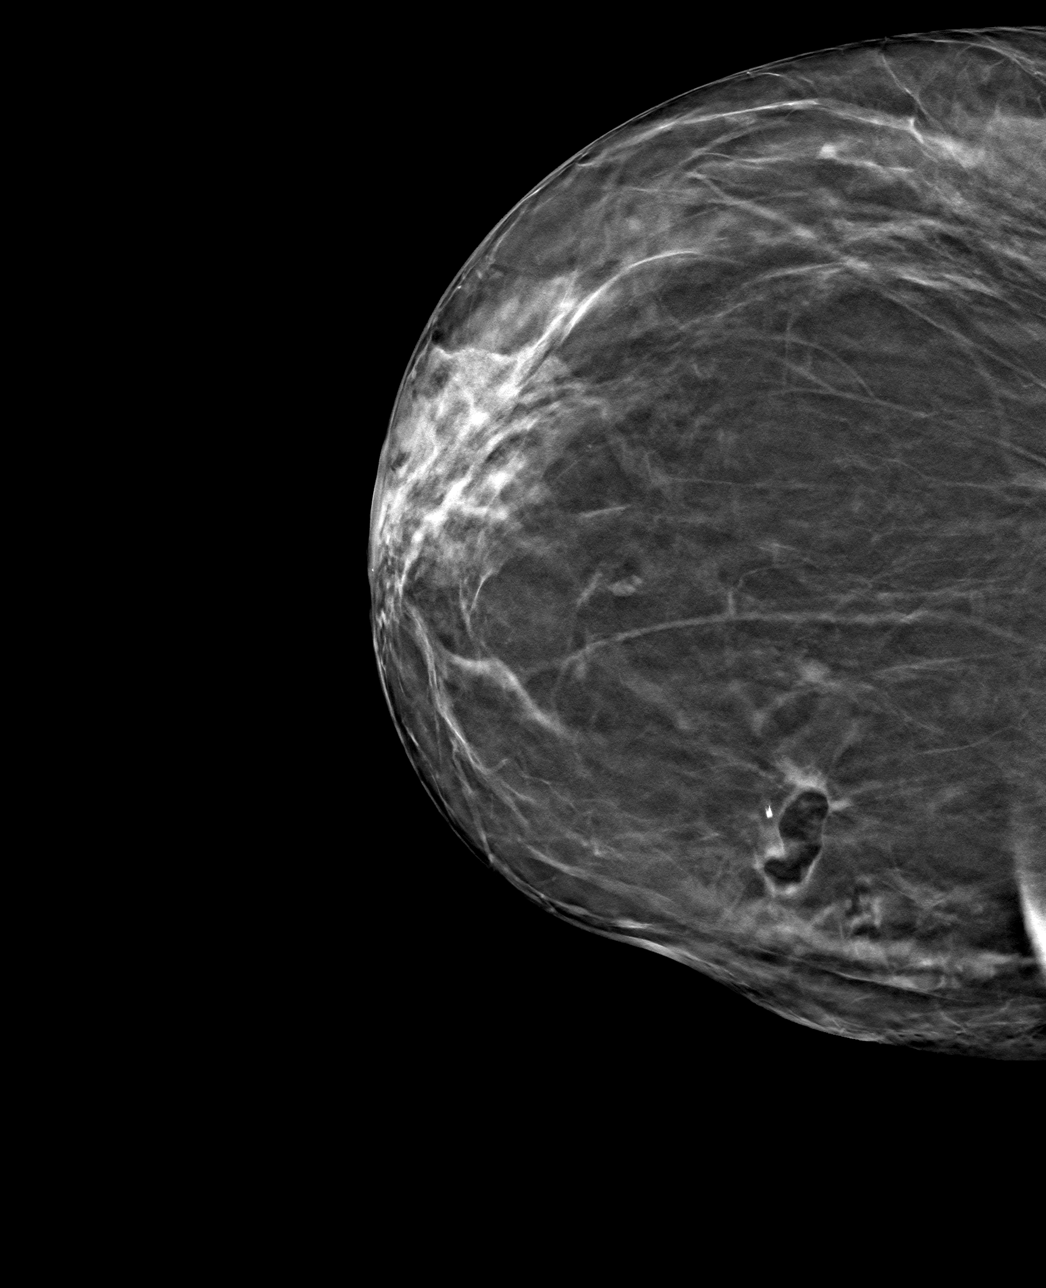

[R ML tomo · tomo slice 37/74.0]
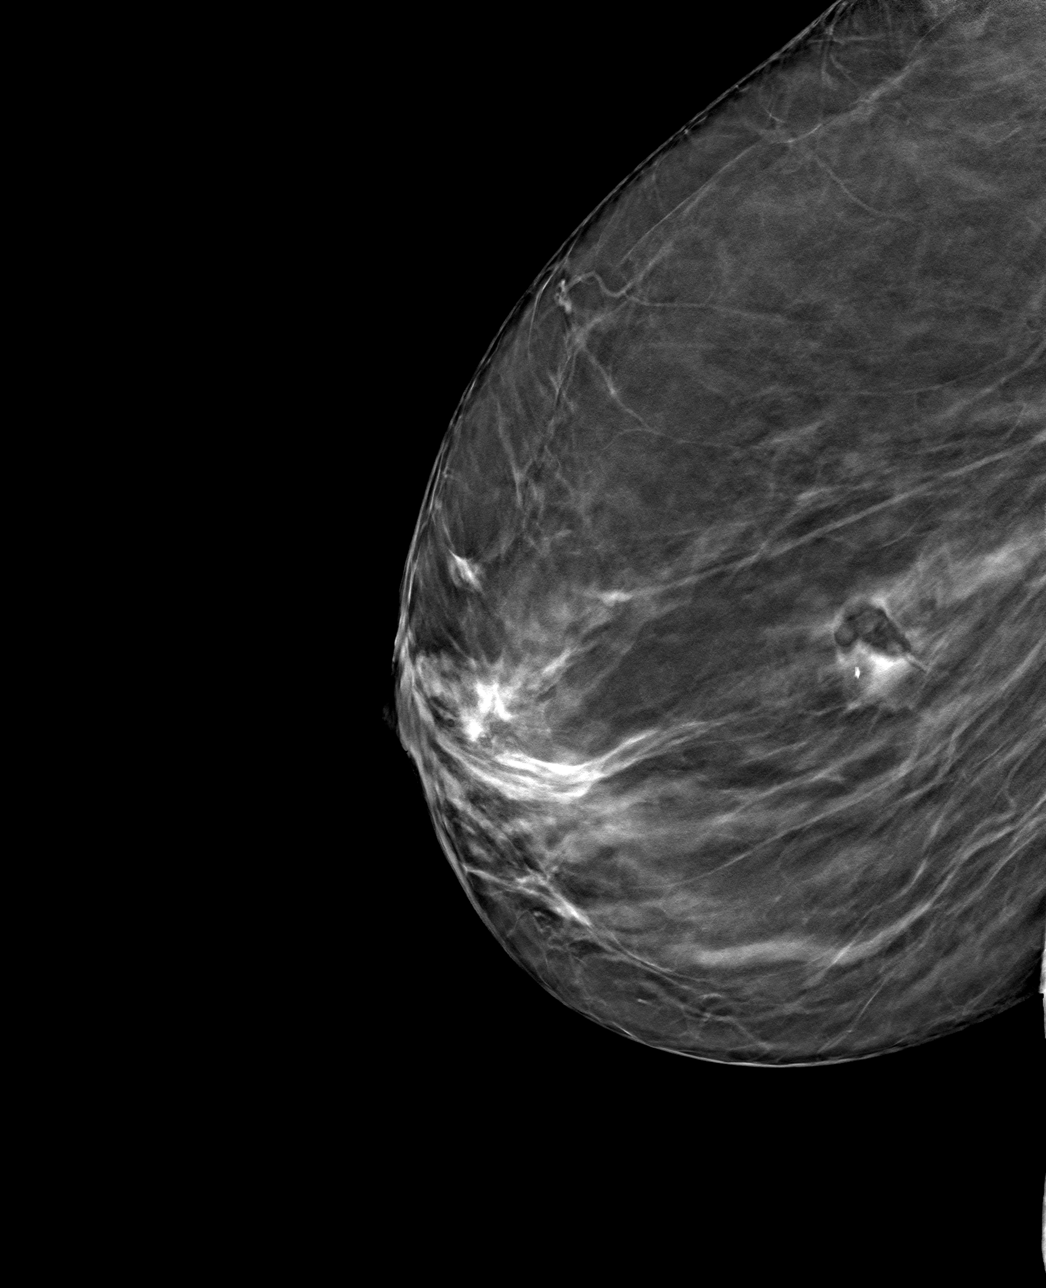

[4 of 12 positions shown; findings below may reference images not displayed]

FINDINGS: 3D Mammographic images were obtained following stereotactic guided
biopsy of right breast distortion. The biopsy marking clip is in
expected position at the site of biopsy.
IMPRESSION: Appropriate positioning of the coil shaped biopsy marking clip at
the site of biopsy in the medial right breast.

Final Assessment: Post Procedure Mammograms for Marker Placement

## 2023-08-03 DIAGNOSIS — H2513 Age-related nuclear cataract, bilateral: Secondary | ICD-10-CM | POA: Diagnosis not present

## 2023-08-03 DIAGNOSIS — H401133 Primary open-angle glaucoma, bilateral, severe stage: Secondary | ICD-10-CM | POA: Diagnosis not present

## 2023-08-29 DIAGNOSIS — H401134 Primary open-angle glaucoma, bilateral, indeterminate stage: Secondary | ICD-10-CM | POA: Diagnosis not present

## 2023-08-31 ENCOUNTER — Encounter: Payer: Self-pay | Admitting: Hematology and Oncology

## 2023-08-31 ENCOUNTER — Inpatient Hospital Stay: Payer: Medicare Other | Attending: Hematology and Oncology | Admitting: Hematology and Oncology

## 2023-08-31 VITALS — BP 158/87 | HR 56 | Temp 97.9°F | Resp 18 | Ht 63.0 in | Wt 179.4 lb

## 2023-08-31 DIAGNOSIS — Z13820 Encounter for screening for osteoporosis: Secondary | ICD-10-CM

## 2023-08-31 DIAGNOSIS — Z79811 Long term (current) use of aromatase inhibitors: Secondary | ICD-10-CM | POA: Diagnosis not present

## 2023-08-31 DIAGNOSIS — C50211 Malignant neoplasm of upper-inner quadrant of right female breast: Secondary | ICD-10-CM | POA: Diagnosis not present

## 2023-08-31 DIAGNOSIS — M722 Plantar fascial fibromatosis: Secondary | ICD-10-CM | POA: Diagnosis not present

## 2023-08-31 DIAGNOSIS — Z780 Asymptomatic menopausal state: Secondary | ICD-10-CM

## 2023-08-31 DIAGNOSIS — Z79899 Other long term (current) drug therapy: Secondary | ICD-10-CM | POA: Insufficient documentation

## 2023-08-31 DIAGNOSIS — Z170 Estrogen receptor positive status [ER+]: Secondary | ICD-10-CM | POA: Diagnosis not present

## 2023-08-31 NOTE — Assessment & Plan Note (Signed)
This is a very pleasant 77 year old female patient with newly diagnosed right breast invasive ductal carcinoma, grade 1, ER +100% strong staining, PR 2% positive strong staining, Ki-67 of 1% and HER2 negative status post right lumpectomy with no residual malignancy.  She completed adj radiation and is now on anastrozole  Assessment and Plan    Breast Cancer On Anastrozole with side effects of hair thinning and occasional hot flashes. No other significant side effects reported. -Continue Anastrozole. -Advised patient that permanent hair dye can be considered.  Leg Pain Bilateral leg pain with numbness and tingling, more pronounced in the right leg. History of meniscus injury. Gabapentin prescribed by another provider but patient has not started it yet. -Encourage patient to discuss Gabapentin use with prescribing provider.  Knee Pain History of meniscus injury in the right knee. Currently using spandex knee pads for support. -Consider orthopedic consultation if pain persists or worsens.  Plantar Fasciitis Using night splints for symptom management. -Continue current management.  Bone Density Awaiting bone density test. -Ensure order for bone density test is sent and patient is contacted for scheduling.  Follow-up in 6 months.

## 2023-08-31 NOTE — Progress Notes (Signed)
Assessment and Plan    Breast Cancer On Anastrozole with side effects of hair thinning and occasional hot flashes. No other significant side effects reported. -Continue Anastrozole. -Advised patient that hair dye can be used.  Leg Pain Bilateral leg pain with numbness and tingling, more pronounced in the right leg. History of meniscus injury. Gabapentin prescribed by another provider but patient has not started it yet. -Encourage patient to discuss Gabapentin use with prescribing provider.  Knee Pain History of meniscus injury in the right knee. Currently using spandex knee pads for support. -Consider orthopedic consultation if pain persists or worsens.  Plantar Fasciitis Using night splints for symptom management. -Continue current management.  Bone Density Awaiting bone density test. -Ensure order for bone density test is sent and patient is contacted for scheduling.  Follow-up in 6 months.        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  Diagnostic mammogram confirmed the architectural distortion and stereotactic biopsy was recommended.   04/15/2022 Initial Diagnosis   Malignant neoplasm of upper-inner quadrant of right breast in female, estrogen receptor positive (HCC)   04/19/2022 Cancer Staging   Staging form: Breast, AJCC 8th Edition - Clinical: Stage IA (cT1, cN0, cM0, G1, ER+, PR+, HER2-) - Signed by Rachel Moulds, MD on 04/19/2022 Histologic grading system: 3 grade system   04/20/2022 Pathology Results   Pathology results showed invasive ductal carcinoma, grade 1, atypical ductal hyperplasia with calcifications, ER 100% positive strong staining intensity, PR 2% positive strong staining intensity, Ki-67 of 1% and tumor cells are negative for  HER2 1+   04/28/2022 Surgery   She had right breast lumpectomy.  No residual malignancy identified, negative for invasive as well as in situ carcinoma.  Prior biopsy site present.  Fibroadenomatoid mastopathy noted.  Previous excision showed 11 mm invasive ductal carcinoma, grade 1 with ER +100% strong staining intensity, PR 2% positive strong staining intensity, Ki-67 of 1% and negative for HER2 1+.   05/05/2022 Genetic Testing   Negative genetic testing on the CancerNext-Expanded+RNAinsight.  The report date is May 245, 2023.  The CancerNext-Expanded gene panel offered by [facility] and includes sequencing and rearrangement analysis for the following 77 genes: AIP, ALK, APC*, ATM*, AXIN2, BAP1, BARD1, BLM, BMPR1A, BRCA1*, BRCA2*, BRIP1*, CDC73, CDH1*, CDK4, CDKN1B, CDKN2A, CHEK2*, CTNNA1, DICER1, FANCC, FH, FLCN, GALNT12, KIF1B, LZTR1, MAX, MEN1, MET, MLH1*, MSH2*, MSH3, MSH6*, MUTYH*, NBN, NF1*, NF2, NTHL1, PALB2*, PHOX2B, PMS2*, POT1, PRKAR1A, PTCH1, PTEN*, RAD51C*, RAD51D*, RB1, RECQL, RET, SDHA, SDHAF2, SDHB, SDHC, SDHD, SMAD4, SMARCA4, SMARCB1, SMARCE1, STK11, SUFU, TMEM127, TP53*, TSC1, TSC2, VHL and XRCC2 (sequencing and deletion/duplication); EGFR, EGLN1, HOXB13, KIT, MITF, PDGFRA, POLD1, and POLE (sequencing only); EPCAM and GREM1 (deletion/duplication only). DNA and RNA analyses performed for * genes.    06/22/2022 - 07/20/2022 Radiation Therapy   Site Technique Total Dose (Gy) Dose per Fx (Gy) Completed Fx Beam Energies  Breast, Right: Breast_R 3D 28.5/28.5 5.7 5/5 10X     07/06/2022 -  Anti-estrogen oral therapy   Anastrozole    INTERVAL HISTORY:   Discussed the use of AI scribe software for clinical note transcription with the patient, who gave verbal consent to proceed.  History of Present Illness    The patient, with a history of asthma and breast cancer, presents with bilateral leg pain, more  pronounced in the left leg due to a previous meniscus injury. The pain is described  as being present in both legs and the tops of both feet, with occasional numbness. She has been prescribed gabapentin for the leg pain, but has not started taking it due to concerns about the medication. She also reports wearing spandex knee pads and sleeping aids for plantar fasciitis to manage the pain.  The patient also reports discontinuing Symbicort for asthma due to concerns about eye side effects. She has been managing her asthma symptoms with eye drops instead.  Regarding her breast cancer treatment, she has been taking anastrozole, which has led to hair thinning and changes in hair texture. She also reports occasional hot flashes and headaches, which she is unsure if they are side effects of the medication or related to her asthma and sinus issues. She has had four previous nose surgeries.  The patient also reports pain in the right breast, which she manages with various bras for comfort. She expresses concerns about the effects of the anastrozole on her hair and asks about the possibility of using hair dye.      Rest of the pertinent 10 point ROS reviewed and neg.  ONCOLOGY TREATMENT TEAM:  1. Surgeon:  Dr. Magnus Ivan at [facility] 2. Medical Oncologist: Dr. Al Pimple  3. Radiation Oncologist: Dr. Basilio Cairo    PAST MEDICAL/SURGICAL HISTORY:  Past Medical History:  Diagnosis Date   ALLERGIC RHINITIS 02/17/2010   Allergy    ASTHMA 02/17/2010   Asthma    Family history of breast cancer    Family history of kidney cancer    Family history of prostate cancer    Family history of stomach cancer    GERD 02/17/2010   Glaucoma    GOITER, MULTINODULAR 02/17/2010   HYPERGLYCEMIA 02/22/2011   HYPERTENSION 02/17/2010   IBS 02/17/2010   Osteopenia 11/2018   T score -1.9 FRAX 7.9% / 1.4%   Personal history of radiation therapy    Retina hole    Unspecified disorder of liver 02/22/2011   Past Surgical History:  Procedure Laterality Date   ABDOMINAL HYSTERECTOMY  1989    Leiomyoma   Bladder Bx  2009   BREAST EXCISIONAL BIOPSY Left 1999   BREAST LUMPECTOMY WITH RADIOACTIVE SEED LOCALIZATION Right 04/28/2022   Procedure: RIGHT BREAST LUMPECTOMY WITH RADIOACTIVE SEED LOCALIZATION;  Surgeon: Abigail Miyamoto, MD;  Location: [facility];  Service: General;  Laterality: Right;  LMA   BREAST SURGERY     Breast cyst   CHOLECYSTECTOMY     EYE SURGERY     EYE SURGERY     Hole in Retina     NOSE SURGERY       ALLERGIES:  Allergies  Allergen Reactions   Other     Other reaction(s): Other (See Comments) bacitacin-races heart   Augmentin [Amoxicillin-Pot Clavulanate]    Clarithromycin    Diltiazem Hcl Other (See Comments)   Terazosin Other (See Comments) and Swelling   Bactrim [Sulfamethoxazole-Trimethoprim] Palpitations     CURRENT MEDICATIONS:  Outpatient Encounter Medications as of 08/31/2023  Medication Sig   albuterol (VENTOLIN HFA) 108 (90 Base) MCG/ACT inhaler Inhale 2 puffs into the lungs as needed.     amLODipine (NORVASC) 5 MG tablet Take 5 mg by mouth daily.     anastrozole (ARIMIDEX) 1 MG tablet Take 1 tablet (1 mg total) by mouth daily.   budesonide-formoterol (SYMBICORT) 160-4.5 MCG/ACT inhaler Inhale 2 puffs into the lungs 2 (two) times  daily.   doxycycline (VIBRAMYCIN) 100 MG capsule Take 1 capsule (100 mg total) by mouth 2 (two) times daily.   fluticasone (FLONASE) 50 MCG/ACT nasal spray Place 2 sprays into the nose as needed.     gabapentin (NEURONTIN) 100 MG capsule Take 1 capsule (100 mg total) by mouth at bedtime.   latanoprost (XALATAN) 0.005 % ophthalmic solution 1 drop at bedtime.   omeprazole (PRILOSEC) 20 MG capsule Take 20 mg by mouth daily as needed.   potassium chloride SA (K-DUR,KLOR-CON) 20 MEQ tablet Take 20 mEq by mouth daily.     timolol (TIMOPTIC) 0.5 % ophthalmic solution    valsartan-hydrochlorothiazide (DIOVAN-HCT) 160-25 MG per tablet Take 1 tablet by mouth daily.     No facility-administered  encounter medications on file as of 08/31/2023.     ONCOLOGIC FAMILY HISTORY:  Family History  Problem Relation Age of Onset   Heart attack Mother        at age of 22 died in her sleep "massive heart attack"   Goiter Mother        benign   Hypertension Father    Heart disease Father    Hypertension Sister    Breast cancer Sister 61   Breast cancer Sister 62   Breast cancer Maternal Aunt 59   Prostate cancer Maternal Uncle        4 maternal uncles   Lung cancer Maternal Uncle    Stomach cancer Paternal Aunt    Liver cancer Paternal Aunt    Kidney cancer Paternal Uncle    Heart disease Maternal Grandmother    Heart disease Maternal Grandfather    Heart disease Paternal Grandmother    Heart disease Paternal Grandfather    Stomach cancer Cousin        maternal cousin   Hypertension Brother    Lung cancer Brother    Cancer Brother        Peritoneal cancer   Stomach cancer Other        MGMs sister   Diabetes Neg Hx       SOCIAL HISTORY:  Social History   Socioeconomic History   Marital status: Married    Spouse name: Not on file   Number of children: Not on file   Years of education: Not on file   Highest education level: Not on file  Occupational History   Occupation: Administration    Employer: [employer]  Tobacco Use   Smoking status: Never   Smokeless tobacco: Never  Vaping Use   Vaping status: Never Used  Substance and Sexual Activity   Alcohol use: Never   Drug use: No   Sexual activity: Not Currently    Birth control/protection: Surgical    Comment: 1st intercourse 77 yo-Fewer than 5 partners  Other Topics Concern   Not on file  Social History Narrative   Not on file   Social Determinants of Health   Financial Resource Strain: Not on file  Food Insecurity: No Food Insecurity (04/22/2022)   Hunger Vital Sign    Worried About Running Out of Food in the Last Year: Never true    Ran Out of Food in the Last Year: Never true  Transportation Needs:  No Transportation Needs (04/22/2022)   PRAPARE - Administrator, Civil Service (Medical): No    Lack of Transportation (Non-Medical): No  Physical Activity: Not on file  Stress: Not on file  Social Connections: Not on file  Intimate Partner Violence: Not on  file     OBSERVATIONS/OBJECTIVE:  BP (!) 158/87 (BP Location: Left Arm, Patient Position: Sitting)   Pulse (!) 56   Temp 97.9 F (36.6 C) (Tympanic)   Resp 18   Ht 5\' 3"  (1.6 m)   Wt 179 lb 6.4 oz (81.4 kg)   SpO2 100%   BMI 31.78 kg/m  GENERAL: Patient is a well appearing female in no acute distress HEENT:  Sclerae anicteric.  Oropharynx clear and moist. No ulcerations or evidence of oropharyngeal candidiasis. Neck is supple.  NODES:  No cervical, supraclavicular, or axillary lymphadenopathy palpated.  BREAST EXAM: Right breast status postlumpectomy and radiation no sign of local recurrence left breast benign. EXTREMITIES:  No peripheral edema.   SKIN:  Clear with no obvious rashes or skin changes. No nail dyscrasia. NEURO:  Nonfocal. Well oriented.  Appropriate affect.  LABORATORY DATA:  None for this visit.  DIAGNOSTIC IMAGING:  None for this visit.   The patient was provided an opportunity to ask questions and all were answered. The patient agreed with the plan and demonstrated an understanding of the instructions.   Total encounter time:30 minutes*in face-to-face visit time, chart review, lab review, care coordination, order entry, and documentation of the encounter time.  *Total Encounter Time as defined by the Centers for Medicare and Medicaid Services includes, in addition to the face-to-face time of a patient visit (documented in the note above) non-face-to-face time: obtaining and reviewing outside history, ordering and reviewing medications, tests or procedures, care coordination (communications with other health care professionals or caregivers) and documentation in the medical record.

## 2023-09-06 DIAGNOSIS — K219 Gastro-esophageal reflux disease without esophagitis: Secondary | ICD-10-CM | POA: Diagnosis not present

## 2023-09-06 DIAGNOSIS — M6740 Ganglion, unspecified site: Secondary | ICD-10-CM | POA: Diagnosis not present

## 2023-09-06 DIAGNOSIS — J4530 Mild persistent asthma, uncomplicated: Secondary | ICD-10-CM | POA: Diagnosis not present

## 2023-09-06 DIAGNOSIS — I10 Essential (primary) hypertension: Secondary | ICD-10-CM | POA: Diagnosis not present

## 2023-09-06 DIAGNOSIS — Z23 Encounter for immunization: Secondary | ICD-10-CM | POA: Diagnosis not present

## 2023-09-06 DIAGNOSIS — C50911 Malignant neoplasm of unspecified site of right female breast: Secondary | ICD-10-CM | POA: Diagnosis not present

## 2023-10-12 ENCOUNTER — Other Ambulatory Visit: Payer: Self-pay | Admitting: Hematology and Oncology

## 2023-10-13 ENCOUNTER — Other Ambulatory Visit: Payer: Self-pay | Admitting: *Deleted

## 2023-11-01 DIAGNOSIS — K5909 Other constipation: Secondary | ICD-10-CM | POA: Diagnosis not present

## 2023-11-01 DIAGNOSIS — R198 Other specified symptoms and signs involving the digestive system and abdomen: Secondary | ICD-10-CM | POA: Diagnosis not present

## 2023-11-01 DIAGNOSIS — K219 Gastro-esophageal reflux disease without esophagitis: Secondary | ICD-10-CM | POA: Diagnosis not present

## 2023-11-01 DIAGNOSIS — R140 Abdominal distension (gaseous): Secondary | ICD-10-CM | POA: Diagnosis not present

## 2023-11-11 ENCOUNTER — Encounter: Payer: Self-pay | Admitting: Emergency Medicine

## 2023-11-11 ENCOUNTER — Ambulatory Visit
Admission: EM | Admit: 2023-11-11 | Discharge: 2023-11-11 | Disposition: A | Discharge status: Home / Self Care | Payer: Medicare Other | Attending: Family Medicine | Admitting: Family Medicine

## 2023-11-11 ENCOUNTER — Ambulatory Visit: Payer: Medicare Other

## 2023-11-11 DIAGNOSIS — M25561 Pain in right knee: Secondary | ICD-10-CM | POA: Diagnosis not present

## 2023-11-11 DIAGNOSIS — W19XXXA Unspecified fall, initial encounter: Secondary | ICD-10-CM | POA: Diagnosis not present

## 2023-11-11 DIAGNOSIS — M25461 Effusion, right knee: Secondary | ICD-10-CM | POA: Diagnosis not present

## 2023-11-11 DIAGNOSIS — M1711 Unilateral primary osteoarthritis, right knee: Secondary | ICD-10-CM | POA: Diagnosis not present

## 2023-11-11 NOTE — Discharge Instructions (Signed)
You may use the RICE protocol, rest, ice, compression, elevation as well as over-the-counter pain relievers, Voltaren gel, Biofreeze for pain relief.  I will call if anything comes back abnormal on your x-ray.

## 2023-11-11 NOTE — ED Triage Notes (Signed)
Fell on concrete driveway on 11/20.  Pain in right knee.

## 2023-11-11 NOTE — ED Provider Notes (Signed)
[facility]    CSN: [id number] Arrival date & time: 11/11/23  1350      History   Chief Complaint No chief complaint on file.   HPI Rachel Barrett is a 77 y.o. female.   Patient presenting today with ongoing right medial knee pain after a fall onto a concrete driveway on 11/02/2023.  She states area has been swollen, slightly bruised but denies loss of range of motion, numbness, tingling, weakness.  So far trying over-the-counter pain relievers and rubs with mild temporary benefit.    Past Medical History:  Diagnosis Date   ALLERGIC RHINITIS 02/17/2010   Allergy    ASTHMA 02/17/2010   Asthma    Family history of breast cancer    Family history of kidney cancer    Family history of prostate cancer    Family history of stomach cancer    GERD 02/17/2010   Glaucoma    GOITER, MULTINODULAR 02/17/2010   HYPERGLYCEMIA 02/22/2011   HYPERTENSION 02/17/2010   IBS 02/17/2010   Osteopenia 11/2018   T score -1.9 FRAX 7.9% / 1.4%   Personal history of radiation therapy    Retina hole    Unspecified disorder of liver 02/22/2011    Patient Active Problem List   Diagnosis Date Noted   Genetic testing 05/06/2022   Family history of breast cancer 04/19/2022   Family history of prostate cancer 04/19/2022   Family history of stomach cancer 04/19/2022   Family history of kidney cancer 04/19/2022   Malignant neoplasm of upper-inner quadrant of right breast in female, estrogen receptor positive (HCC) 04/15/2022   Body mass index (BMI) 34.0-34.9, adult 04/13/2022   Hypercalcemia 04/13/2022   Mild persistent asthma, uncomplicated 04/13/2022   Ovarian cyst 04/13/2022   Anxiety state 10/28/2020   Big thyroid 10/28/2020   Intermediate uveitis, bilateral 06/11/2014   Asthma 09/28/2012   Anterior uveitis 07/18/2012   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   Primary open angle glaucoma 07/18/2012   Retina hole 07/18/2012   Fibroid    Osteopenia    Disorder of liver 02/22/2011    HYPERGLYCEMIA 02/22/2011   GOITER, MULTINODULAR 02/17/2010   HYPERTENSION 02/17/2010   ALLERGIC RHINITIS 02/17/2010   ASTHMA 02/17/2010   GERD 02/17/2010   IBS 02/17/2010    Past Surgical History:  Procedure Laterality Date   ABDOMINAL HYSTERECTOMY  1989   Leiomyoma   Bladder Bx  2009   BREAST EXCISIONAL BIOPSY Left 1999   BREAST LUMPECTOMY WITH RADIOACTIVE SEED LOCALIZATION Right 04/28/2022   Procedure: RIGHT BREAST LUMPECTOMY WITH RADIOACTIVE SEED LOCALIZATION;  Surgeon: Abigail Miyamoto, MD;  Location: [facility];  Service: General;  Laterality: Right;  LMA   BREAST SURGERY     Breast cyst   CHOLECYSTECTOMY     EYE SURGERY     EYE SURGERY     Hole in Retina     NOSE SURGERY      OB History     Gravida  2   Para  1   Term  1   Preterm      AB  1   Living  1      SAB  1   IAB      Ectopic      Multiple      Live Births               Home Medications    Prior to Admission medications   Medication Sig Start Date End Date Taking? Authorizing Provider  albuterol (VENTOLIN HFA) 108 (90 Base) MCG/ACT inhaler Inhale 2 puffs into the lungs as needed.      [provider]  amLODipine (NORVASC) 5 MG tablet Take 5 mg by mouth daily.      [provider]  anastrozole (ARIMIDEX) 1 MG tablet TAKE 1 TABLET BY MOUTH DAILY 10/12/23   Tavari Loadholt Moulds, MD  budesonide-formoterol (SYMBICORT) 160-4.5 MCG/ACT inhaler Inhale 2 puffs into the lungs 2 (two) times daily.    [provider]  doxycycline (VIBRAMYCIN) 100 MG capsule Take 1 capsule (100 mg total) by mouth 2 (two) times daily. 07/22/23   Raspet, Erin K, PA-C  fluticasone (FLONASE) 50 MCG/ACT nasal spray Place 2 sprays into the nose as needed.      [provider]  gabapentin (NEURONTIN) 100 MG capsule Take 1 capsule (100 mg total) by mouth at bedtime. 04/08/23   Felecia Shelling, DPM  latanoprost (XALATAN) 0.005 % ophthalmic solution 1 drop at bedtime.     [provider]  omeprazole (PRILOSEC) 20 MG capsule Take 20 mg by mouth daily as needed.    [provider]  potassium chloride SA (K-DUR,KLOR-CON) 20 MEQ tablet Take 20 mEq by mouth daily.      [provider]  timolol (TIMOPTIC) 0.5 % ophthalmic solution  08/19/20   [provider]  valsartan-hydrochlorothiazide (DIOVAN-HCT) 160-25 MG per tablet Take 1 tablet by mouth daily.      [provider]    Family History Family History  Problem Relation Age of Onset   Heart attack Mother        at age of 7 died in her sleep "massive heart attack"   Goiter Mother        benign   Hypertension Father    Heart disease Father    Hypertension Sister    Breast cancer Sister 2   Breast cancer Sister 80   Breast cancer Maternal Aunt 31   Prostate cancer Maternal Uncle        4 maternal uncles   Lung cancer Maternal Uncle    Stomach cancer Paternal Aunt    Liver cancer Paternal Aunt    Kidney cancer Paternal Uncle    Heart disease Maternal Grandmother    Heart disease Maternal Grandfather    Heart disease Paternal Grandmother    Heart disease Paternal Grandfather    Stomach cancer Cousin        maternal cousin   Hypertension Brother    Lung cancer Brother    Cancer Brother        Peritoneal cancer   Stomach cancer Other        MGMs sister   Diabetes Neg Hx     Social History Social History   Tobacco Use   Smoking status: Never   Smokeless tobacco: Never  Vaping Use   Vaping status: Never Used  Substance Use Topics   Alcohol use: Never   Drug use: No     Allergies   Other, Augmentin [amoxicillin-pot clavulanate], Clarithromycin, Diltiazem hcl, Terazosin, and Bactrim [sulfamethoxazole-trimethoprim]   Review of Systems Review of Systems Per HPI  Physical Exam Triage Vital Signs ED Triage Vitals [11/11/23 1423]  Encounter Vitals Group     BP (!) 162/90     Systolic BP Percentile      Diastolic BP Percentile      Pulse  Rate (!) 55     Resp 18     Temp 97.9 F (36.6 C)  Temp Source Oral     SpO2 95 %     Weight      Height      Head Circumference      Peak Flow      Pain Score 8     Pain Loc      Pain Education      Exclude from Growth Chart    No data found.  Updated Vital Signs BP (!) 162/90 (BP Location: Right Arm)   Pulse (!) 55   Temp 97.9 F (36.6 C) (Oral)   Resp 18   SpO2 95%   Visual Acuity Right Eye Distance:   Left Eye Distance:   Bilateral Distance:    Right Eye Near:   Left Eye Near:    Bilateral Near:     Physical Exam Vitals and nursing note reviewed.  Constitutional:      Appearance: Normal appearance. She is not ill-appearing.  HENT:     Head: Atraumatic.  Eyes:     Extraocular Movements: Extraocular movements intact.     Conjunctiva/sclera: Conjunctivae normal.  Cardiovascular:     Rate and Rhythm: Normal rate and regular rhythm.     Heart sounds: Normal heart sounds.  Pulmonary:     Effort: Pulmonary effort is normal.     Breath sounds: Normal breath sounds.  Musculoskeletal:        General: Normal range of motion.     Cervical back: Normal range of motion and neck supple.  Skin:    General: Skin is warm and dry.     Findings: Bruising present.     Comments: Slight bruising to an area of the right medial knee/distal thigh.  Minimally tender to palpation, negative McMurray's, drawer testing.  No joint instability, normal range of motion and gait  Neurological:     Mental Status: She is alert and oriented to person, place, and time.     Motor: No weakness.     Gait: Gait normal.     Comments: Bilateral lower extremities neurovascularly intact  Psychiatric:        Mood and Affect: Mood normal.        Thought Content: Thought content normal.        Judgment: Judgment normal.      UC Treatments / Results  Labs (all labs ordered are listed, but only abnormal results are displayed) Labs Reviewed - No data to display  EKG   Radiology DG Knee  Complete 4 Views Right  Result Date: 11/11/2023 CLINICAL DATA:  Persistent knee pain since falling 9 days prior. EXAM: RIGHT KNEE - COMPLETE 4+ VIEW COMPARISON:  Radiographs 04/24/2011. FINDINGS: The bones appear mildly demineralized. No evidence of acute fracture or dislocation. There are progressive tricompartmental degenerative changes, most advanced within the lateral compartment. There is a small knee joint effusion. The soft tissues otherwise appear unremarkable. IMPRESSION: Progressive tricompartmental degenerative changes with small knee joint effusion. No acute osseous findings. Electronically Signed   By: Carey Bullocks M.D.   On: 11/11/2023 15:14    Procedures Procedures (including critical care time)  Medications Ordered in UC Medications - No data to display  Initial Impression / Assessment and Plan / UC Course  I have reviewed the triage vital signs and the nursing notes.  Pertinent labs & imaging results that were available during my care of the patient were reviewed by me and considered in my medical decision making (see chart for details).     X-ray of the right  knee negative for acute bony abnormality.  Ace wrap applied, discussed RICE protocol, over-the-counter pain relievers.  Return for worsening symptoms.  Final Clinical Impressions(s) / UC Diagnoses   Final diagnoses:  Acute pain of right knee  Fall, initial encounter     Discharge Instructions      You may use the RICE protocol, rest, ice, compression, elevation as well as over-the-counter pain relievers, Voltaren gel, Biofreeze for pain relief.  I will call if anything comes back abnormal on your x-ray.     ED Prescriptions   None    PDMP not reviewed this encounter.   Particia Nearing, [place] 11/11/23 1523

## 2023-11-29 DIAGNOSIS — J309 Allergic rhinitis, unspecified: Secondary | ICD-10-CM | POA: Diagnosis not present

## 2023-11-29 DIAGNOSIS — J4530 Mild persistent asthma, uncomplicated: Secondary | ICD-10-CM | POA: Diagnosis not present

## 2023-11-29 DIAGNOSIS — I10 Essential (primary) hypertension: Secondary | ICD-10-CM | POA: Diagnosis not present

## 2023-11-29 DIAGNOSIS — Z9181 History of falling: Secondary | ICD-10-CM | POA: Diagnosis not present

## 2023-11-29 DIAGNOSIS — K219 Gastro-esophageal reflux disease without esophagitis: Secondary | ICD-10-CM | POA: Diagnosis not present

## 2023-11-29 DIAGNOSIS — Z853 Personal history of malignant neoplasm of breast: Secondary | ICD-10-CM | POA: Diagnosis not present

## 2023-11-29 DIAGNOSIS — Z23 Encounter for immunization: Secondary | ICD-10-CM | POA: Diagnosis not present

## 2023-11-29 DIAGNOSIS — M179 Osteoarthritis of knee, unspecified: Secondary | ICD-10-CM | POA: Diagnosis not present

## 2023-11-29 DIAGNOSIS — N182 Chronic kidney disease, stage 2 (mild): Secondary | ICD-10-CM | POA: Diagnosis not present

## 2023-11-29 DIAGNOSIS — Z0000 Encounter for general adult medical examination without abnormal findings: Secondary | ICD-10-CM | POA: Diagnosis not present

## 2023-11-29 DIAGNOSIS — E7800 Pure hypercholesterolemia, unspecified: Secondary | ICD-10-CM | POA: Diagnosis not present

## 2023-11-29 DIAGNOSIS — E042 Nontoxic multinodular goiter: Secondary | ICD-10-CM | POA: Diagnosis not present

## 2023-12-05 DIAGNOSIS — M25561 Pain in right knee: Secondary | ICD-10-CM | POA: Diagnosis not present

## 2023-12-19 ENCOUNTER — Other Ambulatory Visit: Payer: Self-pay | Admitting: Hematology and Oncology

## 2023-12-19 DIAGNOSIS — Z853 Personal history of malignant neoplasm of breast: Secondary | ICD-10-CM

## 2023-12-19 DIAGNOSIS — Z13820 Encounter for screening for osteoporosis: Secondary | ICD-10-CM

## 2024-01-10 NOTE — Progress Notes (Signed)
78 y.o. G45P1011 Widowed [place] American female here for a breast and pelvic exam.   New dx of right breast cancer since her last visit.  She is experiencing hair loss since starting Arimidex.   The patient is also followed for osteopenia. Has BMD scheduled for Apr 13, 2024.  Oncology has ordered this.   Occasional RLQ discomfort and radiates to her side for over one year.  She had CT of the abdomen and pelvis 04/01/23:  no acute findings.  Appendix, bowel, kidneys normal.  No comment on ovaries.  Patient asking if the pain could be from her ovary.  States her urine is very clear in color.   Has glaucoma and dry eye.    Husband passed in Jan. 2024.  Had renal failure.   PCP: Georgann Housekeeper, MD   No LMP recorded. Patient has had a hysterectomy.           Sexually active: No.  The current method of family planning is status post hysterectomy.    Menopausal hormone therapy:  n/a Exercising: No.   Smoker:  no  OB History     Gravida  2   Para  1   Term  1   Preterm      AB  1   Living  1      SAB  1   IAB      Ectopic      Multiple      Live Births              HEALTH MAINTENANCE: Last 2 paps: 08-17-18 Neg, 07-18-15 Neg, 06-20-12 Neg  History of abnormal Pap or positive HPV:  no Mammogram:  05/03/23 Breast density cat B, BI-RADS CAT 2 benign Colonoscopy:  04/01/14 Bone Density:  12/23/20  Result  osteopenia   Immunization History  Administered Date(s) Administered   Influenza Whole 09/12/2010   PFIZER(Purple Top)SARS-COV-2 Vaccination 02/10/2020, 03/11/2020   Td 02/10/2009      reports that she has never smoked. She has never used smokeless tobacco. She reports that she does not drink alcohol and does not use drugs.  Past Medical History:  Diagnosis Date   ALLERGIC RHINITIS 02/17/2010   Allergy    ASTHMA 02/17/2010   Asthma    Family history of breast cancer    Family history of kidney cancer    Family history of prostate cancer    Family  history of stomach cancer    GERD 02/17/2010   Glaucoma    GOITER, MULTINODULAR 02/17/2010   HYPERGLYCEMIA 02/22/2011   HYPERTENSION 02/17/2010   IBS 02/17/2010   Osteopenia 11/2018   T score -1.9 FRAX 7.9% / 1.4%   Personal history of radiation therapy    Retina hole    Unspecified disorder of liver 02/22/2011    Past Surgical History:  Procedure Laterality Date   ABDOMINAL HYSTERECTOMY  1989   Leiomyoma   Bladder Bx  2009   BREAST EXCISIONAL BIOPSY Left 1999   BREAST LUMPECTOMY WITH RADIOACTIVE SEED LOCALIZATION Right 04/28/2022   Procedure: RIGHT BREAST LUMPECTOMY WITH RADIOACTIVE SEED LOCALIZATION;  Surgeon: Abigail Miyamoto, MD;  Location: [facility];  Service: General;  Laterality: Right;  LMA   BREAST SURGERY     Breast cyst   CHOLECYSTECTOMY     EYE SURGERY     EYE SURGERY     Hole in Retina     NOSE SURGERY      Current Outpatient Medications  Medication Sig  Dispense Refill   albuterol (VENTOLIN HFA) 108 (90 Base) MCG/ACT inhaler Inhale 2 puffs into the lungs as needed.       amLODipine (NORVASC) 5 MG tablet Take 5 mg by mouth daily.       anastrozole (ARIMIDEX) 1 MG tablet TAKE 1 TABLET BY MOUTH DAILY 90 tablet 3   budesonide-formoterol (SYMBICORT) 160-4.5 MCG/ACT inhaler Inhale 2 puffs into the lungs 2 (two) times daily.     fluticasone (FLONASE) 50 MCG/ACT nasal spray Place 2 sprays into the nose as needed.       lansoprazole (PREVACID) 15 MG capsule 15 mg as needed.     latanoprost (XALATAN) 0.005 % ophthalmic solution 1 drop at bedtime.     omeprazole (PRILOSEC) 20 MG capsule Take 20 mg by mouth daily as needed.     potassium chloride SA (K-DUR,KLOR-CON) 20 MEQ tablet Take 20 mEq by mouth daily.       timolol (TIMOPTIC) 0.5 % ophthalmic solution      valsartan-hydrochlorothiazide (DIOVAN-HCT) 160-25 MG per tablet Take 1 tablet by mouth daily.       No current facility-administered medications for this visit.    ALLERGIES: Other, Augmentin  [amoxicillin-pot clavulanate], Clarithromycin, Diltiazem hcl, Terazosin, and Bactrim [sulfamethoxazole-trimethoprim]  Family History  Problem Relation Age of Onset   Heart attack Mother        at age of 75 died in her sleep "massive heart attack"   Goiter Mother        benign   Hypertension Father    Heart disease Father    Hypertension Sister    Breast cancer Sister 25   Breast cancer Sister 47   Breast cancer Maternal Aunt 27   Prostate cancer Maternal Uncle        4 maternal uncles   Lung cancer Maternal Uncle    Stomach cancer Paternal Aunt    Liver cancer Paternal Aunt    Kidney cancer Paternal Uncle    Heart disease Maternal Grandmother    Heart disease Maternal Grandfather    Heart disease Paternal Grandmother    Heart disease Paternal Grandfather    Stomach cancer Cousin        maternal cousin   Hypertension Brother    Lung cancer Brother    Cancer Brother        Peritoneal cancer   Stomach cancer Other        MGMs sister   Diabetes Neg Hx     Review of Systems  All other systems reviewed and are negative.   PHYSICAL EXAM:  BP 130/84 (BP Location: Right Arm, Patient Position: Sitting, Cuff Size: Small)   Pulse 76   Ht 5\' 3"  (1.6 m)   Wt 169 lb (76.7 kg)   SpO2 97%   BMI 29.94 kg/m     General appearance: alert, cooperative and appears stated age Head: normocephalic, without obvious abnormality, atraumatic Neck: no adenopathy, supple, symmetrical, trachea midline and thyroid normal to inspection and palpation Lungs: clear to auscultation bilaterally Breasts: right - scar noted, no masses or tenderness, No nipple retraction or dimpling, No nipple discharge or bleeding, No axillary adenopathy Left - inversion of nipple (old change), no masses or tenderness, No nipple discharge or bleeding, No axillary adenopathy Heart: regular rate and rhythm Abdomen: soft, non-tender; no masses, no organomegaly Extremities: extremities normal, atraumatic, no cyanosis or  edema Skin: skin color, texture, turgor normal. No rashes or lesions Lymph nodes: cervical, supraclavicular, and axillary nodes normal. Neurologic: grossly normal  Pelvic: External genitalia:  no lesions              No abnormal inguinal nodes palpated.              Urethra:  normal appearing urethra with no masses, tenderness or lesions              Bartholins and Skenes: normal                 Vagina: normal appearing vagina with normal color and discharge, no lesions              Cervix: absent              Pap taken: No. Bimanual Exam:  Uterus: absent              Adnexa: no mass, fullness, tenderness              Rectal exam: Yes.  .  Confirms.              Anus:  normal sphincter tone, no lesions  Chaperone was present for exam:  Warren Lacy, CMA  ASSESSMENT: Encounter for breast and pelvic exam.  Status post TAH for fibroids, 1989.  Ovaries remain.  RLQ pain.  Osteopenia.  Right breast cancer.  Status post lumpectomy, XRT, and is on Arimidex.  Negative personal genetic testing.  FH breast and peritoneal cancer.  Personal left nipple inversion, old change per patient.  PLAN: Mammogram screening discussed. Self breast awareness reviewed. Pap and HRV collected:  No.  Not indicated.  Guidelines for Calcium, Vitamin D, regular exercise program including cardiovascular and weight bearing exercise. Medication refills:  NA Return for pelvic US.  Follow up also for yearly breast and pelvic exam.     Additional counseling given.  Yes.  . 20 min  total time was spent for this patient encounter, including preparation, face-to-face counseling with the patient, coordination of care, and documentation of the encounter regarding her RLQ pain in addition to doing the breast and pelvic exam.

## 2024-01-24 ENCOUNTER — Ambulatory Visit (INDEPENDENT_AMBULATORY_CARE_PROVIDER_SITE_OTHER): Payer: Medicare Other | Admitting: Obstetrics and Gynecology

## 2024-01-24 ENCOUNTER — Encounter: Payer: Self-pay | Admitting: Obstetrics and Gynecology

## 2024-01-24 VITALS — BP 130/84 | HR 76 | Ht 63.0 in | Wt 169.0 lb

## 2024-01-24 DIAGNOSIS — Z853 Personal history of malignant neoplasm of breast: Secondary | ICD-10-CM

## 2024-01-24 DIAGNOSIS — Z9189 Other specified personal risk factors, not elsewhere classified: Secondary | ICD-10-CM

## 2024-01-24 DIAGNOSIS — R1031 Right lower quadrant pain: Secondary | ICD-10-CM

## 2024-01-24 DIAGNOSIS — G8929 Other chronic pain: Secondary | ICD-10-CM

## 2024-01-24 DIAGNOSIS — Z01419 Encounter for gynecological examination (general) (routine) without abnormal findings: Secondary | ICD-10-CM

## 2024-01-24 DIAGNOSIS — Z9289 Personal history of other medical treatment: Secondary | ICD-10-CM

## 2024-01-24 NOTE — Patient Instructions (Signed)

## 2024-02-09 NOTE — Progress Notes (Deleted)
 GYNECOLOGY  VISIT   HPI: 78 y.o.   Married  [place] female   [phone] with No LMP recorded. Patient has had a hysterectomy.   here for: U/S consult     GYNECOLOGIC HISTORY: No LMP recorded. Patient has had a hysterectomy. Contraception:  hyst Menopausal hormone therapy:  n/a Last 2 paps:  08-17-18 Neg, 07-18-15 Neg, 06-20-12 Neg  History of abnormal Pap or positive HPV:  no Mammogram:  05/03/23 Breast density cat B, BI-RADS CAT 2 benign         OB History     Gravida  2   Para  1   Term  1   Preterm      AB  1   Living  1      SAB  1   IAB      Ectopic      Multiple      Live Births                 Patient Active Problem List   Diagnosis Date Noted   Genetic testing 05/06/2022   Family history of breast cancer 04/19/2022   Family history of prostate cancer 04/19/2022   Family history of stomach cancer 04/19/2022   Family history of kidney cancer 04/19/2022   Malignant neoplasm of upper-inner quadrant of right breast in female, estrogen receptor positive (HCC) 04/15/2022   Body mass index (BMI) 34.0-34.9, adult 04/13/2022   Hypercalcemia 04/13/2022   Mild persistent asthma, uncomplicated 04/13/2022   Ovarian cyst 04/13/2022   Anxiety state 10/28/2020   Big thyroid 10/28/2020   Intermediate uveitis, bilateral 06/11/2014   Asthma 09/28/2012   Anterior uveitis 07/18/2012   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   Primary open angle glaucoma 07/18/2012   Retina hole 07/18/2012   Fibroid    Osteopenia    Disorder of liver 02/22/2011   HYPERGLYCEMIA 02/22/2011   GOITER, MULTINODULAR 02/17/2010   HYPERTENSION 02/17/2010   ALLERGIC RHINITIS 02/17/2010   ASTHMA 02/17/2010   GERD 02/17/2010   IBS 02/17/2010    Past Medical History:  Diagnosis Date   ALLERGIC RHINITIS 02/17/2010   Allergy    ASTHMA 02/17/2010   Asthma    Family history of breast cancer    Family history of kidney cancer    Family history of prostate cancer    Family history of  stomach cancer    GERD 02/17/2010   Glaucoma    GOITER, MULTINODULAR 02/17/2010   HYPERGLYCEMIA 02/22/2011   HYPERTENSION 02/17/2010   IBS 02/17/2010   Osteopenia 11/2018   T score -1.9 FRAX 7.9% / 1.4%   Personal history of radiation therapy    Retina hole    Unspecified disorder of liver 02/22/2011    Past Surgical History:  Procedure Laterality Date   ABDOMINAL HYSTERECTOMY  1989   Leiomyoma   Bladder Bx  2009   BREAST EXCISIONAL BIOPSY Left 1999   BREAST LUMPECTOMY WITH RADIOACTIVE SEED LOCALIZATION Right 04/28/2022   Procedure: RIGHT BREAST LUMPECTOMY WITH RADIOACTIVE SEED LOCALIZATION;  Surgeon: Abigail Miyamoto, MD;  Location: [facility];  Service: General;  Laterality: Right;  LMA   BREAST SURGERY     Breast cyst   CHOLECYSTECTOMY     EYE SURGERY     EYE SURGERY     Hole in Retina     NOSE SURGERY      Current Outpatient Medications  Medication Sig Dispense Refill   albuterol (VENTOLIN HFA) 108 (90 Base) MCG/ACT inhaler Inhale  2 puffs into the lungs as needed.       amLODipine (NORVASC) 5 MG tablet Take 5 mg by mouth daily.       anastrozole (ARIMIDEX) 1 MG tablet TAKE 1 TABLET BY MOUTH DAILY 90 tablet 3   budesonide-formoterol (SYMBICORT) 160-4.5 MCG/ACT inhaler Inhale 2 puffs into the lungs 2 (two) times daily.     fluticasone (FLONASE) 50 MCG/ACT nasal spray Place 2 sprays into the nose as needed.       lansoprazole (PREVACID) 15 MG capsule 15 mg as needed.     latanoprost (XALATAN) 0.005 % ophthalmic solution 1 drop at bedtime.     omeprazole (PRILOSEC) 20 MG capsule Take 20 mg by mouth daily as needed.     potassium chloride SA (K-DUR,KLOR-CON) 20 MEQ tablet Take 20 mEq by mouth daily.       timolol (TIMOPTIC) 0.5 % ophthalmic solution      valsartan-hydrochlorothiazide (DIOVAN-HCT) 160-25 MG per tablet Take 1 tablet by mouth daily.       No current facility-administered medications for this visit.     ALLERGIES: Other, Augmentin  [amoxicillin-pot clavulanate], Clarithromycin, Diltiazem hcl, Terazosin, and Bactrim [sulfamethoxazole-trimethoprim]  Family History  Problem Relation Age of Onset   Heart attack Mother        at age of 49 died in her sleep "massive heart attack"   Goiter Mother        benign   Hypertension Father    Heart disease Father    Hypertension Sister    Breast cancer Sister 6   Breast cancer Sister 33   Breast cancer Maternal Aunt 30   Prostate cancer Maternal Uncle        4 maternal uncles   Lung cancer Maternal Uncle    Stomach cancer Paternal Aunt    Liver cancer Paternal Aunt    Kidney cancer Paternal Uncle    Heart disease Maternal Grandmother    Heart disease Maternal Grandfather    Heart disease Paternal Grandmother    Heart disease Paternal Grandfather    Stomach cancer Cousin        maternal cousin   Hypertension Brother    Lung cancer Brother    Cancer Brother        Peritoneal cancer   Stomach cancer Other        MGMs sister   Diabetes Neg Hx     Social History   Socioeconomic History   Marital status: Married    Spouse name: Not on file   Number of children: Not on file   Years of education: Not on file   Highest education level: Not on file  Occupational History   Occupation: Administration    Employer: [employer]  Tobacco Use   Smoking status: Never   Smokeless tobacco: Never  Vaping Use   Vaping status: Never Used  Substance and Sexual Activity   Alcohol use: Never   Drug use: No   Sexual activity: Not Currently    Birth control/protection: Surgical    Comment: 1st intercourse 78 yo-Fewer than 5 partners  Other Topics Concern   Not on file  Social History Narrative   Not on file   Social Drivers of Health   Financial Resource Strain: Not on file  Food Insecurity: No Food Insecurity (04/22/2022)   Hunger Vital Sign    Worried About Running Out of Food in the Last Year: Never true    Ran Out of Food in the Last Year: Never  true   Transportation Needs: No Transportation Needs (04/22/2022)   PRAPARE - Administrator, Civil Service (Medical): No    Lack of Transportation (Non-Medical): No  Physical Activity: Not on file  Stress: Not on file  Social Connections: Not on file  Intimate Partner Violence: Not on file    Review of Systems  PHYSICAL EXAMINATION:   There were no vitals taken for this visit.    General appearance: alert, cooperative and appears stated age Head: Normocephalic, without obvious abnormality, atraumatic Neck: no adenopathy, supple, symmetrical, trachea midline and thyroid normal to inspection and palpation Lungs: clear to auscultation bilaterally Breasts: normal appearance, no masses or tenderness, No nipple retraction or dimpling, No nipple discharge or bleeding, No axillary or supraclavicular adenopathy Heart: regular rate and rhythm Abdomen: soft, non-tender, no masses,  no organomegaly Extremities: extremities normal, atraumatic, no cyanosis or edema Skin: Skin color, texture, turgor normal. No rashes or lesions Lymph nodes: Cervical, supraclavicular, and axillary nodes normal. No abnormal inguinal nodes palpated Neurologic: Grossly normal  Pelvic: External genitalia:  no lesions              Urethra:  normal appearing urethra with no masses, tenderness or lesions              Bartholins and Skenes: normal                 Vagina: normal appearing vagina with normal color and discharge, no lesions              Cervix: no lesions                Bimanual Exam:  Uterus:  normal size, contour, position, consistency, mobility, non-tender              Adnexa: no mass, fullness, tenderness              Rectal exam: {yes no:314532}.  Confirms.              Anus:  normal sphincter tone, no lesions  Chaperone was present for exam:  {BSCHAPERONE:[id number]::"Benjermin Korber F, CMA"}  ASSESSMENT:    PLAN:    {LABS (Optional):23779}  ***  total time was spent for this patient encounter,  including preparation, face-to-face counseling with the patient, coordination of care, and documentation of the encounter.

## 2024-02-23 ENCOUNTER — Other Ambulatory Visit: Payer: Medicare Other

## 2024-02-23 ENCOUNTER — Other Ambulatory Visit: Payer: Medicare Other | Admitting: Obstetrics and Gynecology

## 2024-02-27 ENCOUNTER — Telehealth: Payer: Self-pay

## 2024-02-27 NOTE — Telephone Encounter (Signed)
 Spoke with patient and confirmed appointment on 02/28/24

## 2024-02-28 ENCOUNTER — Inpatient Hospital Stay: Payer: Medicare Other | Attending: Hematology and Oncology | Admitting: Hematology and Oncology

## 2024-02-28 VITALS — BP 133/63 | HR 65 | Temp 97.3°F | Resp 16 | Wt 169.8 lb

## 2024-02-28 DIAGNOSIS — Z1721 Progesterone receptor positive status: Secondary | ICD-10-CM | POA: Diagnosis not present

## 2024-02-28 DIAGNOSIS — Z803 Family history of malignant neoplasm of breast: Secondary | ICD-10-CM | POA: Insufficient documentation

## 2024-02-28 DIAGNOSIS — Z8042 Family history of malignant neoplasm of prostate: Secondary | ICD-10-CM | POA: Insufficient documentation

## 2024-02-28 DIAGNOSIS — C50211 Malignant neoplasm of upper-inner quadrant of right female breast: Secondary | ICD-10-CM | POA: Diagnosis not present

## 2024-02-28 DIAGNOSIS — Z170 Estrogen receptor positive status [ER+]: Secondary | ICD-10-CM | POA: Diagnosis not present

## 2024-02-28 DIAGNOSIS — Z1732 Human epidermal growth factor receptor 2 negative status: Secondary | ICD-10-CM | POA: Insufficient documentation

## 2024-02-28 DIAGNOSIS — Z800 Family history of malignant neoplasm of digestive organs: Secondary | ICD-10-CM | POA: Insufficient documentation

## 2024-02-28 DIAGNOSIS — Z801 Family history of malignant neoplasm of trachea, bronchus and lung: Secondary | ICD-10-CM | POA: Diagnosis not present

## 2024-02-28 DIAGNOSIS — Z79811 Long term (current) use of aromatase inhibitors: Secondary | ICD-10-CM | POA: Diagnosis not present

## 2024-02-28 DIAGNOSIS — Z79899 Other long term (current) drug therapy: Secondary | ICD-10-CM | POA: Insufficient documentation

## 2024-02-28 NOTE — Progress Notes (Signed)
 Malignant neoplasm of upper-inner quadrant of right breast in female, estrogen receptor positive (HCC) This is a very pleasant 78 year old female patient with newly diagnosed right breast invasive ductal carcinoma, grade 1, ER +100% strong staining, PR 2% positive strong staining, Ki-67 of 1% and HER2 negative status post right lumpectomy with no residual malignancy.  She completed adj radiation and is now on anastrozole  Assessment and Plan  Breast cancer On anastrozole with no new issues. Occasional breast pain without changes on examination. No palpable lymph nodes. No need for breast surgeon follow-up currently. - Continue anastrozole. -Proceed with mammogram and Bone density as scheduled.  Hot flashes Attributed to anastrozole, not flu-related.  Medication management Discontinued Symbicort due to ocular side effects.  Transitioned from Prevacid to omeprazole. - Discontinue Symbicort. - Continue omeprazole.  Follow-up - Scheduled for follow-up in six months. Advised to call if issues arise. - Schedule follow-up appointment in six months.        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  Diagnostic mammogram confirmed the architectural distortion and stereotactic biopsy was recommended.   04/15/2022 Initial Diagnosis   Malignant neoplasm of upper-inner quadrant of right breast in female, estrogen receptor positive (HCC)   04/19/2022 Cancer Staging   Staging form: Breast, AJCC 8th Edition - Clinical: Stage IA (cT1, cN0, cM0, G1, ER+, PR+, HER2-) - Signed by Rachel Moulds, MD on 04/19/2022 Histologic grading system: 3 grade system   04/20/2022 Pathology Results   Pathology results showed invasive ductal carcinoma, grade 1, atypical ductal hyperplasia  with calcifications, ER 100% positive strong staining intensity, PR 2% positive strong staining intensity, Ki-67 of 1% and tumor cells are negative for HER2 1+   04/28/2022 Surgery   She had right breast lumpectomy.  No residual malignancy identified, negative for invasive as well as in situ carcinoma.  Prior biopsy site present.  Fibroadenomatoid mastopathy noted.  Previous excision showed 11 mm invasive ductal carcinoma, grade 1 with ER +100% strong staining intensity, PR 2% positive strong staining intensity, Ki-67 of 1% and negative for HER2 1+.   05/05/2022 Genetic Testing   Negative genetic testing on the CancerNext-Expanded+RNAinsight.  The report date is May 245, 2023.  The CancerNext-Expanded gene panel offered by [facility] and includes sequencing and rearrangement analysis for the following 77 genes: AIP, ALK, APC*, ATM*, AXIN2, BAP1, BARD1, BLM, BMPR1A, BRCA1*, BRCA2*, BRIP1*, CDC73, CDH1*, CDK4, CDKN1B, CDKN2A, CHEK2*, CTNNA1, DICER1, FANCC, FH, FLCN, GALNT12, KIF1B, LZTR1, MAX, MEN1, MET, MLH1*, MSH2*, MSH3, MSH6*, MUTYH*, NBN, NF1*, NF2, NTHL1, PALB2*, PHOX2B, PMS2*, POT1, PRKAR1A, PTCH1, PTEN*, RAD51C*, RAD51D*, RB1, RECQL, RET, SDHA, SDHAF2, SDHB, SDHC, SDHD, SMAD4, SMARCA4, SMARCB1, SMARCE1, STK11, SUFU, TMEM127, TP53*, TSC1, TSC2, VHL and XRCC2 (sequencing and deletion/duplication); EGFR, EGLN1, HOXB13, KIT, MITF, PDGFRA, POLD1, and POLE (sequencing only); EPCAM and GREM1 (deletion/duplication only). DNA and RNA analyses performed for * genes.    06/22/2022 - 07/20/2022 Radiation Therapy   Site Technique Total Dose (Gy) Dose per Fx (Gy) Completed Fx Beam Energies  Breast, Right: Breast_R 3D 28.5/28.5 5.7 5/5 10X     07/06/2022 -  Anti-estrogen oral therapy   Anastrozole    INTERVAL HISTORY:   Discussed the use of AI scribe software for clinical note transcription with the patient, who gave verbal consent to proceed.  History of Present Illness  Rachel Barrett is a 78 year  old female with breast cancer who presents for a follow-up visit.  She is currently taking anastrozole every night before bed for breast cancer management. She has not noticed any new breast changes but experiences occasional breast pain and hot flashes, which she attributes to the medication. She is scheduled for a bone density test and mammogram in May.  She has experienced sinus problems and vertigo on one side, which lasted for about two weeks but have since improved. Despite these issues, she has been increasing her physical activity by walking more.  She experiences intermittent leg pain but has not started taking gabapentin, which was previously prescribed. She recalls that someone gave it to her, but she does not take it.  She has discontinued using Symbicort due to its effects on her eyes and has switched to omeprazole.  Rest of the pertinent 10 point ROS reviewed and neg.  ONCOLOGY TREATMENT TEAM:  1. Surgeon:  Dr. Magnus Ivan at [facility] 2. Medical Oncologist: Dr. Al Pimple  3. Radiation Oncologist: Dr. Basilio Cairo    PAST MEDICAL/SURGICAL HISTORY:  Past Medical History:  Diagnosis Date   ALLERGIC RHINITIS 02/17/2010   Allergy    ASTHMA 02/17/2010   Asthma    Family history of breast cancer    Family history of kidney cancer    Family history of prostate cancer    Family history of stomach cancer    GERD 02/17/2010   Glaucoma    GOITER, MULTINODULAR 02/17/2010   HYPERGLYCEMIA 02/22/2011   HYPERTENSION 02/17/2010   IBS 02/17/2010   Osteopenia 11/2018   T score -1.9 FRAX 7.9% / 1.4%   Personal history of radiation therapy    Retina hole    Unspecified disorder of liver 02/22/2011   Past Surgical History:  Procedure Laterality Date   ABDOMINAL HYSTERECTOMY  1989   Leiomyoma   Bladder Bx  2009   BREAST EXCISIONAL BIOPSY Left 1999   BREAST LUMPECTOMY WITH RADIOACTIVE SEED LOCALIZATION Right 04/28/2022   Procedure: RIGHT BREAST LUMPECTOMY WITH RADIOACTIVE SEED  LOCALIZATION;  Surgeon: Abigail Miyamoto, MD;  Location: [facility];  Service: General;  Laterality: Right;  LMA   BREAST SURGERY     Breast cyst   CHOLECYSTECTOMY     EYE SURGERY     EYE SURGERY     Hole in Retina     NOSE SURGERY       ALLERGIES:  Allergies  Allergen Reactions   Other     Other reaction(s): Other (See Comments) bacitacin-races heart   Augmentin [Amoxicillin-Pot Clavulanate]    Clarithromycin    Diltiazem Hcl Other (See Comments)   Terazosin Other (See Comments) and Swelling   Bactrim [Sulfamethoxazole-Trimethoprim] Palpitations     CURRENT MEDICATIONS:  Outpatient Encounter Medications as of 02/28/2024  Medication Sig   dorzolamide-timolol (COSOPT) 2-0.5 % ophthalmic solution 1 drop 2 (two) times daily.   albuterol (VENTOLIN HFA) 108 (90 Base) MCG/ACT inhaler Inhale 2 puffs into the lungs as needed.     amLODipine (NORVASC) 5 MG tablet Take 5 mg by mouth daily.     anastrozole (ARIMIDEX) 1 MG tablet TAKE 1 TABLET BY MOUTH DAILY   fluticasone (FLONASE) 50 MCG/ACT nasal spray Place 2 sprays into the nose as needed.     latanoprost (XALATAN) 0.005 % ophthalmic solution 1 drop at bedtime.   omeprazole (PRILOSEC) 20 MG capsule Take 20 mg by mouth daily as needed.   potassium chloride SA (K-DUR,KLOR-CON) 20  MEQ tablet Take 20 mEq by mouth daily.     valsartan-hydrochlorothiazide (DIOVAN-HCT) 160-25 MG per tablet Take 1 tablet by mouth daily.     [DISCONTINUED] budesonide-formoterol (SYMBICORT) 160-4.5 MCG/ACT inhaler Inhale 2 puffs into the lungs 2 (two) times daily.   [DISCONTINUED] lansoprazole (PREVACID) 15 MG capsule 15 mg as needed.   [DISCONTINUED] timolol (TIMOPTIC) 0.5 % ophthalmic solution    No facility-administered encounter medications on file as of 02/28/2024.     ONCOLOGIC FAMILY HISTORY:  Family History  Problem Relation Age of Onset   Heart attack Mother        at age of 71 died in her sleep "massive heart attack"   Goiter  Mother        benign   Hypertension Father    Heart disease Father    Hypertension Sister    Breast cancer Sister 55   Breast cancer Sister 34   Breast cancer Maternal Aunt 22   Prostate cancer Maternal Uncle        4 maternal uncles   Lung cancer Maternal Uncle    Stomach cancer Paternal Aunt    Liver cancer Paternal Aunt    Kidney cancer Paternal Uncle    Heart disease Maternal Grandmother    Heart disease Maternal Grandfather    Heart disease Paternal Grandmother    Heart disease Paternal Grandfather    Stomach cancer Cousin        maternal cousin   Hypertension Brother    Lung cancer Brother    Cancer Brother        Peritoneal cancer   Stomach cancer Other        MGMs sister   Diabetes Neg Hx       SOCIAL HISTORY:  Social History   Socioeconomic History   Marital status: Married    Spouse name: Not on file   Number of children: Not on file   Years of education: Not on file   Highest education level: Not on file  Occupational History   Occupation: Administration    Employer: [employer]  Tobacco Use   Smoking status: Never   Smokeless tobacco: Never  Vaping Use   Vaping status: Never Used  Substance and Sexual Activity   Alcohol use: Never   Drug use: No   Sexual activity: Not Currently    Birth control/protection: Surgical    Comment: 1st intercourse 78 yo-Fewer than 5 partners  Other Topics Concern   Not on file  Social History Narrative   Not on file   Social Drivers of Health   Financial Resource Strain: Not on file  Food Insecurity: No Food Insecurity (04/22/2022)   Hunger Vital Sign    Worried About Running Out of Food in the Last Year: Never true    Ran Out of Food in the Last Year: Never true  Transportation Needs: No Transportation Needs (04/22/2022)   PRAPARE - Administrator, Civil Service (Medical): No    Lack of Transportation (Non-Medical): No  Physical Activity: Not on file  Stress: Not on file  Social Connections: Not  on file  Intimate Partner Violence: Not on file     OBSERVATIONS/OBJECTIVE:  BP 133/63 (BP Location: Left Arm, Patient Position: Sitting)   Pulse 65   Temp (!) 97.3 F (36.3 C) (Temporal)   Resp 16   Wt 169 lb 12.8 oz (77 kg)   SpO2 98%   BMI 30.08 kg/m   GENERAL: Patient is a well  appearing female in no acute distress HEENT:  Sclerae anicteric.  Oropharynx clear and moist. No ulcerations or evidence of oropharyngeal candidiasis. Neck is supple.  NODES:  No cervical, supraclavicular, or axillary lymphadenopathy palpated.  BREAST EXAM: Right breast status postlumpectomy and radiation no sign of local recurrence left breast benign. EXTREMITIES:  No peripheral edema.   SKIN:  Clear with no obvious rashes or skin changes. No nail dyscrasia. NEURO:  Nonfocal. Well oriented.  Appropriate affect.  LABORATORY DATA:  None for this visit.  DIAGNOSTIC IMAGING:  None for this visit.   The patient was provided an opportunity to ask questions and all were answered. The patient agreed with the plan and demonstrated an understanding of the instructions.   Total encounter time:20 minutes*in face-to-face visit time, chart review, lab review, care coordination, order entry, and documentation of the encounter time.  *Total Encounter Time as defined by the Centers for Medicare and Medicaid Services includes, in addition to the face-to-face time of a patient visit (documented in the note above) non-face-to-face time: obtaining and reviewing outside history, ordering and reviewing medications, tests or procedures, care coordination (communications with other health care professionals or caregivers) and documentation in the medical record.

## 2024-02-28 NOTE — Assessment & Plan Note (Addendum)
 This is a very pleasant 78 year old female patient with newly diagnosed right breast invasive ductal carcinoma, grade 1, ER +100% strong staining, PR 2% positive strong staining, Ki-67 of 1% and HER2 negative status post right lumpectomy with no residual malignancy.  She completed adj radiation and is now on anastrozole  Assessment and Plan  Breast cancer On anastrozole with no new issues. Occasional breast pain without changes on examination. No palpable lymph nodes. No need for breast surgeon follow-up currently. - Continue anastrozole. -Proceed with mammogram and Bone density as scheduled.  Hot flashes Attributed to anastrozole, not flu-related.  Medication management Discontinued Symbicort due to ocular side effects.  Transitioned from Prevacid to omeprazole. - Discontinue Symbicort. - Continue omeprazole.  Follow-up - Scheduled for follow-up in six months. Advised to call if issues arise. - Schedule follow-up appointment in six months.

## 2024-03-06 DIAGNOSIS — K219 Gastro-esophageal reflux disease without esophagitis: Secondary | ICD-10-CM | POA: Diagnosis not present

## 2024-03-06 DIAGNOSIS — R140 Abdominal distension (gaseous): Secondary | ICD-10-CM | POA: Diagnosis not present

## 2024-03-06 DIAGNOSIS — K5909 Other constipation: Secondary | ICD-10-CM | POA: Diagnosis not present

## 2024-03-08 DIAGNOSIS — H401132 Primary open-angle glaucoma, bilateral, moderate stage: Secondary | ICD-10-CM | POA: Diagnosis not present

## 2024-03-08 DIAGNOSIS — H2513 Age-related nuclear cataract, bilateral: Secondary | ICD-10-CM | POA: Diagnosis not present

## 2024-04-13 ENCOUNTER — Ambulatory Visit
Admission: RE | Admit: 2024-04-13 | Discharge: 2024-04-13 | Disposition: A | Discharge status: Home / Self Care | Payer: Medicare Other | Source: Ambulatory Visit | Attending: Hematology and Oncology

## 2024-04-13 DIAGNOSIS — Z13820 Encounter for screening for osteoporosis: Secondary | ICD-10-CM

## 2024-04-13 DIAGNOSIS — M8588 Other specified disorders of bone density and structure, other site: Secondary | ICD-10-CM | POA: Diagnosis not present

## 2024-04-16 DIAGNOSIS — H401134 Primary open-angle glaucoma, bilateral, indeterminate stage: Secondary | ICD-10-CM | POA: Diagnosis not present

## 2024-04-26 ENCOUNTER — Ambulatory Visit: Payer: Self-pay | Admitting: *Deleted

## 2024-05-04 ENCOUNTER — Ambulatory Visit
Admission: RE | Admit: 2024-05-04 | Discharge: 2024-05-04 | Disposition: A | Discharge status: Home / Self Care | Payer: Medicare Other | Source: Ambulatory Visit | Attending: Hematology and Oncology | Admitting: Hematology and Oncology

## 2024-05-04 DIAGNOSIS — Z853 Personal history of malignant neoplasm of breast: Secondary | ICD-10-CM

## 2024-05-04 DIAGNOSIS — Z08 Encounter for follow-up examination after completed treatment for malignant neoplasm: Secondary | ICD-10-CM | POA: Diagnosis not present

## 2024-05-28 DIAGNOSIS — H401134 Primary open-angle glaucoma, bilateral, indeterminate stage: Secondary | ICD-10-CM | POA: Diagnosis not present

## 2024-07-02 DIAGNOSIS — J019 Acute sinusitis, unspecified: Secondary | ICD-10-CM | POA: Diagnosis not present

## 2024-07-02 DIAGNOSIS — J3489 Other specified disorders of nose and nasal sinuses: Secondary | ICD-10-CM | POA: Diagnosis not present

## 2024-07-02 DIAGNOSIS — K219 Gastro-esophageal reflux disease without esophagitis: Secondary | ICD-10-CM | POA: Diagnosis not present

## 2024-07-02 DIAGNOSIS — H9201 Otalgia, right ear: Secondary | ICD-10-CM | POA: Diagnosis not present

## 2024-07-02 DIAGNOSIS — R0981 Nasal congestion: Secondary | ICD-10-CM | POA: Diagnosis not present

## 2024-07-06 ENCOUNTER — Other Ambulatory Visit: Payer: Self-pay | Admitting: Hematology and Oncology

## 2024-08-16 DIAGNOSIS — K5909 Other constipation: Secondary | ICD-10-CM | POA: Diagnosis not present

## 2024-08-16 DIAGNOSIS — Z1211 Encounter for screening for malignant neoplasm of colon: Secondary | ICD-10-CM | POA: Diagnosis not present

## 2024-08-16 DIAGNOSIS — K219 Gastro-esophageal reflux disease without esophagitis: Secondary | ICD-10-CM | POA: Diagnosis not present

## 2024-08-16 DIAGNOSIS — R140 Abdominal distension (gaseous): Secondary | ICD-10-CM | POA: Diagnosis not present

## 2024-08-30 ENCOUNTER — Inpatient Hospital Stay: Attending: Hematology and Oncology | Admitting: Hematology and Oncology

## 2024-08-30 VITALS — BP 150/79 | HR 59 | Temp 98.3°F | Resp 16 | Wt 172.0 lb

## 2024-08-30 DIAGNOSIS — Z170 Estrogen receptor positive status [ER+]: Secondary | ICD-10-CM

## 2024-08-30 DIAGNOSIS — C50211 Malignant neoplasm of upper-inner quadrant of right female breast: Secondary | ICD-10-CM | POA: Diagnosis not present

## 2024-08-30 DIAGNOSIS — Z79811 Long term (current) use of aromatase inhibitors: Secondary | ICD-10-CM | POA: Diagnosis not present

## 2024-08-30 DIAGNOSIS — Z17411 Hormone receptor positive with human epidermal growth factor receptor 2 negative status: Secondary | ICD-10-CM | POA: Diagnosis not present

## 2024-08-30 MED ORDER — ANASTROZOLE 1 MG PO TABS: 1.0000 mg | ORAL_TABLET | Freq: Every day | ORAL | 2 refills | Status: AC

## 2024-08-30 NOTE — Assessment & Plan Note (Signed)
 This is a very pleasant 78 year old female patient with newly diagnosed right breast invasive ductal carcinoma, grade 1, ER +100% strong staining, PR 2% positive strong staining, Ki-67 of 1% and HER2 negative status post right lumpectomy with no residual malignancy.  She completed adj radiation and is now on anastrozole   Assessment and Plan  Breast cancer On anastrozole  with no new issues.

## 2024-08-30 NOTE — Progress Notes (Signed)
 No problem-specific Assessment & Plan notes found for this encounter.   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  Diagnostic mammogram confirmed the architectural distortion and stereotactic biopsy was recommended.   04/15/2022 Initial Diagnosis   Malignant neoplasm of upper-inner quadrant of right breast in female, estrogen receptor positive (HCC)   04/19/2022 Cancer Staging   Staging form: Breast, AJCC 8th Edition - Clinical: Stage IA (cT1, cN0, cM0, G1, ER+, PR+, HER2-) - Signed by Loretha Ash, MD on 04/19/2022 Histologic grading system: 3 grade system   04/20/2022 Pathology Results   Pathology results showed invasive ductal carcinoma, grade 1, atypical ductal hyperplasia with calcifications, ER 100% positive strong staining intensity, PR 2% positive strong staining intensity, Ki-67 of 1% and tumor cells are negative for HER2 1+   04/28/2022 Surgery   She had right breast lumpectomy.  No residual malignancy identified, negative for invasive as well as in situ carcinoma.  Prior biopsy site present.  Fibroadenomatoid mastopathy noted.  Previous excision showed 11 mm invasive ductal carcinoma, grade 1 with ER +100% strong staining intensity, PR 2% positive strong staining intensity, Ki-67 of 1% and negative for HER2 1+.   05/05/2022 Genetic Testing   Negative genetic testing on the CancerNext-Expanded+RNAinsight.  The report date is May 245, 2023.  The CancerNext-Expanded gene panel offered by [facility] and includes sequencing and rearrangement analysis for the following 77 genes: AIP, ALK, APC*, ATM*, AXIN2, BAP1, BARD1, BLM, BMPR1A, BRCA1*, BRCA2*, BRIP1*, CDC73, CDH1*, CDK4, CDKN1B, CDKN2A, CHEK2*, CTNNA1, DICER1, FANCC, FH, FLCN, GALNT12, KIF1B, LZTR1, MAX,  MEN1, MET, MLH1*, MSH2*, MSH3, MSH6*, MUTYH*, NBN, NF1*, NF2, NTHL1, PALB2*, PHOX2B, PMS2*, POT1, PRKAR1A, PTCH1, PTEN*, RAD51C*, RAD51D*, RB1, RECQL, RET, SDHA, SDHAF2, SDHB, SDHC, SDHD, SMAD4, SMARCA4, SMARCB1, SMARCE1, STK11, SUFU, TMEM127, TP53*, TSC1, TSC2, VHL and XRCC2 (sequencing and deletion/duplication); EGFR, EGLN1, HOXB13, KIT, MITF, PDGFRA, POLD1, and POLE (sequencing only); EPCAM and GREM1 (deletion/duplication only). DNA and RNA analyses performed for * genes.    06/22/2022 - 07/20/2022 Radiation Therapy   Site Technique Total Dose (Gy) Dose per Fx (Gy) Completed Fx Beam Energies  Breast, Right: Breast_R 3D 28.5/28.5 5.7 5/5 10X     07/06/2022 -  Anti-estrogen oral therapy   Anastrozole     INTERVAL HISTORY:   History of Present Illness    Discussed the use of AI scribe software for clinical note transcription with the patient, who gave verbal consent to proceed.  History of Present Illness Rachel Barrett is a 78 year old female with asthma and breast cancer who presents with persistent congestion and dizziness.  She experiences persistent congestion and asthma symptoms despite previous treatments. She was treated with prednisone for five days and an anti-inflammatory for ten days earlier this summer, but her symptoms have persisted. She describes a feeling of being 'blocked up' in her chest and uses albuterol,  cough syrup, and a vapor rub at night to manage her symptoms. She also uses a vaporizer, which she refers to as a 'pill mess', to help open her nasal passages.  She is currently taking anastrozole  for breast cancer every night and experiences hot flashes as a side effect. She inquires about potential stomach pain associated with the medication but does not report any. She mentions soreness in her right breast post-surgery, which she describes as feeling like '  the blood ain't circulating'.  She experiences dizziness, particularly when getting out of bed, which she attributes  to her right ear. She has a history of multiple nasal surgeries, which she believes have affected her ear and nostril. She has been on the same blood pressure medication for over thirty years and reports occasional dizziness and balance issues, which she associates with her medication.  She is taking vitamin D and calcium supplements. She reports not walking every day.  Rest of the pertinent 10 point ROS reviewed and neg.  ONCOLOGY TREATMENT TEAM:  1. Surgeon:  Dr. Vernetta at [facility] 2. Medical Oncologist: Dr. Loretha  3. Radiation Oncologist: Dr. Izell    PAST MEDICAL/SURGICAL HISTORY:  Past Medical History:  Diagnosis Date   ALLERGIC RHINITIS 02/17/2010   Allergy    ASTHMA 02/17/2010   Asthma    Family history of breast cancer    Family history of kidney cancer    Family history of prostate cancer    Family history of stomach cancer    GERD 02/17/2010   Glaucoma    GOITER, MULTINODULAR 02/17/2010   HYPERGLYCEMIA 02/22/2011   HYPERTENSION 02/17/2010   IBS 02/17/2010   Osteopenia 11/2018   T score -1.9 FRAX 7.9% / 1.4%   Personal history of radiation therapy    Retina hole    Unspecified disorder of liver 02/22/2011   Past Surgical History:  Procedure Laterality Date   ABDOMINAL HYSTERECTOMY  1989   Leiomyoma   Bladder Bx  2009   BREAST EXCISIONAL BIOPSY Left 1999   BREAST LUMPECTOMY Right 04/2022   BREAST LUMPECTOMY WITH RADIOACTIVE SEED LOCALIZATION Right 04/28/2022   Procedure: RIGHT BREAST LUMPECTOMY WITH RADIOACTIVE SEED LOCALIZATION;  Surgeon: Vernetta Berg, MD;  Location: [facility];  Service: General;  Laterality: Right;  LMA   BREAST SURGERY     Breast cyst   CHOLECYSTECTOMY     EYE SURGERY     EYE SURGERY     Hole in Retina     NOSE SURGERY       ALLERGIES:  Allergies  Allergen Reactions   Other     Other reaction(s): Other (See Comments) bacitacin-races heart   Augmentin [Amoxicillin-Pot Clavulanate]     Clarithromycin    Diltiazem Hcl Other (See Comments)   Terazosin Other (See Comments) and Swelling   Bactrim [Sulfamethoxazole-Trimethoprim] Palpitations     CURRENT MEDICATIONS:  Outpatient Encounter Medications as of 08/30/2024  Medication Sig   albuterol (VENTOLIN HFA) 108 (90 Base) MCG/ACT inhaler Inhale 2 puffs into the lungs as needed.     amLODipine (NORVASC) 5 MG tablet Take 5 mg by mouth daily.     anastrozole  (ARIMIDEX ) 1 MG tablet TAKE 1 TABLET BY MOUTH DAILY   dorzolamide-timolol (COSOPT) 2-0.5 % ophthalmic solution 1 drop 2 (two) times daily.   fluticasone (FLONASE) 50 MCG/ACT nasal spray Place 2 sprays into the nose as needed.     latanoprost (XALATAN) 0.005 % ophthalmic solution 1 drop at bedtime.   omeprazole (PRILOSEC) 20 MG capsule Take 20 mg by mouth daily as needed.   potassium chloride SA (K-DUR,KLOR-CON) 20 MEQ tablet Take 20 mEq by mouth daily.     valsartan-hydrochlorothiazide (DIOVAN-HCT) 160-25 MG per tablet Take 1 tablet by mouth daily.     No facility-administered encounter medications on file as of 08/30/2024.     ONCOLOGIC FAMILY HISTORY:  Family History  Problem Relation Age of Onset   Heart attack Mother  at age of 73 died in her sleep massive heart attack   Goiter Mother        benign   Hypertension Father    Heart disease Father    Hypertension Sister    Breast cancer Sister 52   Breast cancer Sister 71   Breast cancer Maternal Aunt 26   Prostate cancer Maternal Uncle        4 maternal uncles   Lung cancer Maternal Uncle    Stomach cancer Paternal Aunt    Liver cancer Paternal Aunt    Kidney cancer Paternal Uncle    Heart disease Maternal Grandmother    Heart disease Maternal Grandfather    Heart disease Paternal Grandmother    Heart disease Paternal Grandfather    Stomach cancer Cousin        maternal cousin   Hypertension Brother    Lung cancer Brother    Cancer Brother        Peritoneal cancer   Stomach cancer Other         MGMs sister   Diabetes Neg Hx       SOCIAL HISTORY:  Social History   Socioeconomic History   Marital status: Married    Spouse name: Not on file   Number of children: Not on file   Years of education: Not on file   Highest education level: Not on file  Occupational History   Occupation: Administration    Employer: [employer]  Tobacco Use   Smoking status: Never   Smokeless tobacco: Never  Vaping Use   Vaping status: Never Used  Substance and Sexual Activity   Alcohol use: Never   Drug use: No   Sexual activity: Not Currently    Birth control/protection: Surgical    Comment: 1st intercourse 78 yo-Fewer than 5 partners  Other Topics Concern   Not on file  Social History Narrative   Not on file   Social Drivers of Health   Financial Resource Strain: Not on file  Food Insecurity: No Food Insecurity (04/22/2022)   Hunger Vital Sign    Worried About Running Out of Food in the Last Year: Never true    Ran Out of Food in the Last Year: Never true  Transportation Needs: No Transportation Needs (04/22/2022)   PRAPARE - Administrator, Civil Service (Medical): No    Lack of Transportation (Non-Medical): No  Physical Activity: Not on file  Stress: Not on file  Social Connections: Not on file  Intimate Partner Violence: Not on file     OBSERVATIONS/OBJECTIVE:  There were no vitals taken for this visit.  GENERAL: Patient is a well appearing female in no acute distress HEENT:  Sclerae anicteric.  Oropharynx clear and moist. No ulcerations or evidence of oropharyngeal candidiasis. Neck is supple.  NODES:  No cervical, supraclavicular, or axillary lymphadenopathy palpated.  BREAST EXAM: Right breast status postlumpectomy and radiation no sign of local recurrence left breast benign. EXTREMITIES:  No peripheral edema.     LABORATORY DATA:  None for this visit.  DIAGNOSTIC IMAGING:  None for this visit.   Assessment and Plan Assessment & Plan Malignant  neoplasm of upper-inner quadrant of right breast in female, estrogen receptor positive (HCC) This is a very pleasant 78 year old female patient with newly diagnosed right breast invasive ductal carcinoma, grade 1, ER +100% strong staining, PR 2% positive strong staining, Ki-67 of 1% and HER2 negative status post right lumpectomy with no residual malignancy.  She completed  adj radiation and is now on anastrozole    Assessment and Plan Assessment & Plan Breast cancer Managed with anastrozole . Experiences hot flashes. Tolerating reasonably well. - Continue anastrozole  therapy. - Manage medication refills. - Encourage daily walking for 20-30 minutes.  Osteopenia Decreased bone density. Taking vitamin D and calcium. - Encourage daily walking for 20-30 minutes.  Asthma with upper airway congestion Persistent symptoms with upper airway congestion. Uses albuterol, cough syrup, and vaporizer. FU with PCP with any further symptoms CTA bilaterally on exam today.  Hypertension Slightly elevated readings. Continue medication as needed.  Dizziness likely multifactorial Multifactorial dizziness with right ear symptoms and orthostatic changes. History of ear surgery may contribute. - Advise against sudden movements. - Encourage holding onto support when standing.     The patient was provided an opportunity to ask questions and all were answered. The patient agreed with the plan and demonstrated an understanding of the instructions.   Total encounter time:30 minutes*in face-to-face visit time, chart review, lab review, care coordination, order entry, and documentation of the encounter time.  *Total Encounter Time as defined by the Centers for Medicare and Medicaid Services includes, in addition to the face-to-face time of a patient visit (documented in the note above) non-face-to-face time: obtaining and reviewing outside history, ordering and reviewing medications, tests or procedures, care  coordination (communications with other health care professionals or caregivers) and documentation in the medical record.

## 2024-09-06 DIAGNOSIS — D121 Benign neoplasm of appendix: Secondary | ICD-10-CM | POA: Diagnosis not present

## 2024-09-06 DIAGNOSIS — Z1211 Encounter for screening for malignant neoplasm of colon: Secondary | ICD-10-CM | POA: Diagnosis not present

## 2024-09-06 DIAGNOSIS — K449 Diaphragmatic hernia without obstruction or gangrene: Secondary | ICD-10-CM | POA: Diagnosis not present

## 2024-09-06 DIAGNOSIS — K2930 Chronic superficial gastritis without bleeding: Secondary | ICD-10-CM | POA: Diagnosis not present

## 2024-09-06 DIAGNOSIS — K3189 Other diseases of stomach and duodenum: Secondary | ICD-10-CM | POA: Diagnosis not present

## 2024-09-06 DIAGNOSIS — K219 Gastro-esophageal reflux disease without esophagitis: Secondary | ICD-10-CM | POA: Diagnosis not present

## 2024-09-06 DIAGNOSIS — D120 Benign neoplasm of cecum: Secondary | ICD-10-CM | POA: Diagnosis not present

## 2024-09-17 DIAGNOSIS — H52203 Unspecified astigmatism, bilateral: Secondary | ICD-10-CM | POA: Diagnosis not present

## 2024-09-17 DIAGNOSIS — H401132 Primary open-angle glaucoma, bilateral, moderate stage: Secondary | ICD-10-CM | POA: Diagnosis not present

## 2024-09-17 DIAGNOSIS — H2513 Age-related nuclear cataract, bilateral: Secondary | ICD-10-CM | POA: Diagnosis not present

## 2024-10-04 DIAGNOSIS — H401134 Primary open-angle glaucoma, bilateral, indeterminate stage: Secondary | ICD-10-CM | POA: Diagnosis not present

## 2025-01-28 ENCOUNTER — Encounter: Payer: Medicare Other | Admitting: Obstetrics and Gynecology

## 2025-02-27 ENCOUNTER — Ambulatory Visit: Admitting: Hematology and Oncology

## 2025-04-22 ENCOUNTER — Encounter: Admitting: Obstetrics and Gynecology
